# Patient Record
Sex: Female | Born: 1990 | Race: White | Hispanic: No | Marital: Single | Smoking: Never smoker
Health system: Southern US, Community
[De-identification: ages and names within clinical notes are randomized; demographics above are authoritative.]

## PROBLEM LIST (undated history)

## (undated) DIAGNOSIS — Z789 Other specified health status: Secondary | ICD-10-CM

## (undated) HISTORY — PX: NO PAST SURGERIES: SHX2092

---

## 2016-03-13 NOTE — L&D Delivery Note (Signed)
Delivery Note After 4 hours of pushing, the FHR began to have prolonged decel in the 80's.  Verbal consent: obtained from patient. Risks and benefits discussed in detail. Risks include, but are not limited to bleeding, infection, damage to maternal tissues, fetal cephalhematoma. There is also the risk of inability to effect vaginal delivery of the head, or shoulder dystocia that cannot be resolved by established maneuvers, leading to the need for emergency cesarean section  At 11:26 PM a viable female was delivered via Vaginal, Vacuum (Extractor) with 1 pop off (Presentation: ROA;  ).  APGAR: 5, 7; weight  Pending.  The shoulders were not forthcoming, so the posterior (right) axilla was grasped with my index finger, and the baby was rotated clockwise into the oblique diameter.  At this point, the (now) anterior shoulder was released, and the baby delivered.  At no time was any traction placed on the baby's head. .THe baby was floppy despite stimulation, so the cord was milked toward the baby, clamped and cut and the baby transferred to the awaiting NICU team.  Cord pH: pending  Anesthesia:  epidural Episiotomy: None Lacerations: 2nd degree;Vaginal Suture Repair: 3.0 vicryl Est. Blood Loss (mL): 394  Mom to postpartum.  Baby to Couplet care / Skin to Skin.  CRESENZO-DISHMAN,Jazlyn Tippens 02/09/2017, 12:04 AM  Please schedule this patient for PP visit in: 4 weeks Low risk pregnancy complicated by:  Delivery mode:  Vacuum Anticipated Birth Control:  Nexplanon PP Procedures needed:   Schedule Integrated BH visit: no Provider: Any provider

## 2016-07-03 ENCOUNTER — Encounter: Payer: Self-pay | Admitting: General Practice

## 2016-07-03 ENCOUNTER — Ambulatory Visit (INDEPENDENT_AMBULATORY_CARE_PROVIDER_SITE_OTHER): Payer: Self-pay | Admitting: *Deleted

## 2016-07-03 ENCOUNTER — Encounter: Payer: Self-pay | Admitting: *Deleted

## 2016-07-03 DIAGNOSIS — Z3201 Encounter for pregnancy test, result positive: Secondary | ICD-10-CM

## 2016-07-03 LAB — POCT PREGNANCY, URINE: PREG TEST UR: POSITIVE — AB

## 2016-07-03 NOTE — Progress Notes (Signed)
Patient presents to clinic for pregnancy test which is positive. EDD 02/04/17. Reviewed allergies and meds. Discussed starting prenatal vitamins, healthy lifestyle. Patient to front desk to schedule new ob visit and get pregnancy verification letter.

## 2016-08-01 ENCOUNTER — Ambulatory Visit (INDEPENDENT_AMBULATORY_CARE_PROVIDER_SITE_OTHER): Payer: BLUE CROSS/BLUE SHIELD | Admitting: Medical

## 2016-08-01 ENCOUNTER — Encounter: Payer: Self-pay | Admitting: Medical

## 2016-08-01 DIAGNOSIS — Z3400 Encounter for supervision of normal first pregnancy, unspecified trimester: Secondary | ICD-10-CM | POA: Insufficient documentation

## 2016-08-01 DIAGNOSIS — Z113 Encounter for screening for infections with a predominantly sexual mode of transmission: Secondary | ICD-10-CM

## 2016-08-01 DIAGNOSIS — Z124 Encounter for screening for malignant neoplasm of cervix: Secondary | ICD-10-CM

## 2016-08-01 DIAGNOSIS — Z3401 Encounter for supervision of normal first pregnancy, first trimester: Secondary | ICD-10-CM

## 2016-08-01 LAB — POCT URINALYSIS DIP (DEVICE)
BILIRUBIN URINE: NEGATIVE
GLUCOSE, UA: NEGATIVE mg/dL
HGB URINE DIPSTICK: NEGATIVE
Ketones, ur: NEGATIVE mg/dL
LEUKOCYTES UA: NEGATIVE
NITRITE: NEGATIVE
Protein, ur: NEGATIVE mg/dL
Specific Gravity, Urine: 1.02 (ref 1.005–1.030)
UROBILINOGEN UA: 0.2 mg/dL (ref 0.0–1.0)
pH: 7 (ref 5.0–8.0)

## 2016-08-01 LAB — OB RESULTS CONSOLE RUBELLA ANTIBODY, IGM: RUBELLA: IMMUNE

## 2016-08-01 NOTE — Progress Notes (Signed)
   PRENATAL VISIT NOTE  Subjective:  Felicia Lang is a 26 y.o. G1P0 at 9246w2d being seen today for ongoing prenatal care.  She is currently monitored for the following issues for this low-risk pregnancy and has Supervision of low-risk first pregnancy on her problem list.  Patient reports no complaints.  Contractions: Not present. Vag. Bleeding: None.  Movement: Absent. Denies leaking of fluid.   The following portions of the patient's history were reviewed and updated as appropriate: allergies, current medications, past family history, past medical history, past social history, past surgical history and problem list. Problem list updated.  Objective:   Vitals:   08/01/16 0815 08/01/16 0824  BP: 132/81   Pulse: 97   Weight: 145 lb (65.8 kg)   Height:  5\' 3"  (1.6 m)    Fetal Status: Fetal Heart Rate (bpm): 169   Movement: Absent     General:  Alert, oriented and cooperative. Patient is in no acute distress.  Skin: Skin is warm and dry. No rash noted.   Cardiovascular: Normal heart rate noted  Respiratory: Normal respiratory effort, no problems with respiration noted  Abdomen: Soft, non-tender, gravid, appropriate for gestational age. Pain/Pressure: Present     Pelvic:  Cervical exam performed Dilation: Closed Effacement (%): Thick   Normal pink vaginal tissue. Normal cervical contour without lesions. Small amount of thick, white discharge noted. Scant bleeding after pap. No CMT.  Extremities: Normal range of motion.  Edema: None  Breast Symmetric. No lumps. No nipple discharge. Mild diffuse tenderness.   Mental Status: Normal mood and affect. Normal behavior. Normal judgment and thought content.   Assessment and Plan:  Pregnancy: G1P0 at 2046w2d  1. Encounter for supervision of low-risk first pregnancy in first trimester - Culture, OB Urine - Prenatal Profile I - HIV antibody - US MFM OB COMP + 14 WK; scheduled - Cytology - PAP - US Fetal Nuchal Translucency Measurement; -  unable to schedule prior to [redacted] weeks GA, patient advised to call to see if there is a cancellation this week - Discussed prenatal classes, list of pediatricians given  - No risk factors for early GTT - Discussed routine prenatal care expectations  First trimester warning symptoms and general obstetric precautions including but not limited to vaginal bleeding, contractions, leaking of fluid and fetal movement were reviewed in detail with the patient. Please refer to After Visit Summary for other counseling recommendations.  Return in about 4 weeks (around 08/29/2016) for LOB.   Vonzella NippleJulie Mynor Witkop, PA-C

## 2016-08-01 NOTE — Patient Instructions (Addendum)
Second Trimester of Pregnancy The second trimester is from week 13 through week 28, month 4 through 6. This is often the time in pregnancy that you feel your best. Often times, morning sickness has lessened or quit. You may have more energy, and you may get hungry more often. Your unborn baby (fetus) is growing rapidly. At the end of the sixth month, he or she is about 9 inches long and weighs about 1 pounds. You will likely feel the baby move (quickening) between 18 and 20 weeks of pregnancy. Follow these instructions at home:  Avoid all smoking, herbs, and alcohol. Avoid drugs not approved by your doctor.  Do not use any tobacco products, including cigarettes, chewing tobacco, and electronic cigarettes. If you need help quitting, ask your doctor. You may get counseling or other support to help you quit.  Only take medicine as told by your doctor. Some medicines are safe and some are not during pregnancy.  Exercise only as told by your doctor. Stop exercising if you start having cramps.  Eat regular, healthy meals.  Wear a good support bra if your breasts are tender.  Do not use hot tubs, steam rooms, or saunas.  Wear your seat belt when driving.  Avoid raw meat, uncooked cheese, and liter boxes and soil used by cats.  Take your prenatal vitamins.  Take 1500-2000 milligrams of calcium daily starting at the 20th week of pregnancy until you deliver your baby.  Try taking medicine that helps you poop (stool softener) as needed, and if your doctor approves. Eat more fiber by eating fresh fruit, vegetables, and whole grains. Drink enough fluids to keep your pee (urine) clear or pale yellow.  Take warm water baths (sitz baths) to soothe pain or discomfort caused by hemorrhoids. Use hemorrhoid cream if your doctor approves.  If you have puffy, bulging veins (varicose veins), wear support hose. Raise (elevate) your feet for 15 minutes, 3-4 times a day. Limit salt in your diet.  Avoid heavy  lifting, wear low heals, and sit up straight.  Rest with your legs raised if you have leg cramps or low back pain.  Visit your dentist if you have not gone during your pregnancy. Use a soft toothbrush to brush your teeth. Be gentle when you floss.  You can have sex (intercourse) unless your doctor tells you not to.  Go to your doctor visits. Get help if:  You feel dizzy.  You have mild cramps or pressure in your lower belly (abdomen).  You have a nagging pain in your belly area.  You continue to feel sick to your stomach (nauseous), throw up (vomit), or have watery poop (diarrhea).  You have bad smelling fluid coming from your vagina.  You have pain with peeing (urination). Get help right away if:  You have a fever.  You are leaking fluid from your vagina.  You have spotting or bleeding from your vagina.  You have severe belly cramping or pain.  You lose or gain weight rapidly.  You have trouble catching your breath and have chest pain.  You notice sudden or extreme puffiness (swelling) of your face, hands, ankles, feet, or legs.  You have not felt the baby move in over an hour.  You have severe headaches that do not go away with medicine.  You have vision changes. This information is not intended to replace advice given to you by your health care provider. Make sure you discuss any questions you have with your health care   provider. Document Released: 05/24/2009 Document Revised: 08/05/2015 Document Reviewed: 04/30/2012 Elsevier Interactive Patient Education  2017 [place] [address] [place], [place]  [address] Phone - [phone]   Fax - [phone]  [facility] [address] [place], [place] [address] Phone - [phone]   Fax - [place] [address] [place], [place]  [address] Phone - [phone]   Fax -  [phone]  [place] [address] [place] [address] [place], [place]  [address] Phone - [phone]   Fax - [phone]  [place] [address] [place], [place]  [address] Phone - [phone]   Fax - [phone]  [facility] [address] [place], [place]  [address] Phone - [phone]   Fax - [place] [address], [place], [place]  [address] Phone - [phone]   Fax - [phone]  [place] [address] [place], [place] [address] [place], [place]  [address] Phone - [phone]   Fax - [place] [address] [place], [place]  [address] Phone - [phone]   Fax - [phone] [facility] [place] [address] [place], [place]  [address] Phone - [phone]   Fax - [phone]  [place] [address] [place], [place]  [address] Phone - [phone]   Fax - [phone]  [facility] [place], [place], [place]  [address] Phone - [phone]   Fax - [place] [address] [place], [place]  [address] Phone - [phone]   Fax - [phone]  [facility] [address], [place], [place]  [address] Phone - [place], [place]  [address] Phone - [phone]   Fax - [place] [address] [place], [place]  [address] Phone - [phone]   Fax - [phone]  [place] [address] [place], [place]  [address] Phone - [phone]   Fax - [place]. [place], [place]  [address] Phone - [phone]   Fax - [place], [place], [place]   [address] Phone - [phone]   Fax - [place] [address] [place], [place]  [address] Phone - [phone]   Fax - [phone]  [facility] [address] [place], [place]  [address] Phone - [phone]   Fax - [place] [address], [place], [place]  [address] Phone - [phone]   Fax - [phone]  [place] [address] [place], [place]  [address] Phone - [phone]   Fax - [phone] [place] [address] [place], [place]  [address] Phone - [phone]   Fax - [place] [address] [place],   [address] Phone - [phone]   Fax - [place] [address], [place],   [address] Phone - [phone]   Fax - [phone]  [place] [address] [place] [address] Phone  [phone]  Fax [phone]

## 2016-08-01 NOTE — Progress Notes (Signed)
DECLINED FLU INJECTION

## 2016-08-02 LAB — PRENATAL PROFILE I(LABCORP)
Antibody Screen: NEGATIVE
BASOS ABS: 0 10*3/uL (ref 0.0–0.2)
Basos: 0 %
EOS (ABSOLUTE): 0.1 10*3/uL (ref 0.0–0.4)
EOS: 1 %
HEP B S AG: NEGATIVE
Hematocrit: 39.1 % (ref 34.0–46.6)
Hemoglobin: 12.9 g/dL (ref 11.1–15.9)
IMMATURE GRANULOCYTES: 0 %
Immature Grans (Abs): 0 10*3/uL (ref 0.0–0.1)
Lymphocytes Absolute: 2.1 10*3/uL (ref 0.7–3.1)
Lymphs: 25 %
MCH: 29.8 pg (ref 26.6–33.0)
MCHC: 33 g/dL (ref 31.5–35.7)
MCV: 90 fL (ref 79–97)
MONOCYTES: 5 %
Monocytes Absolute: 0.4 10*3/uL (ref 0.1–0.9)
NEUTROS PCT: 69 %
Neutrophils Absolute: 5.7 10*3/uL (ref 1.4–7.0)
PLATELETS: 260 10*3/uL (ref 150–379)
RBC: 4.33 x10E6/uL (ref 3.77–5.28)
RDW: 14.2 % (ref 12.3–15.4)
RH TYPE: POSITIVE
RPR: NONREACTIVE
RUBELLA: 1.3 {index} (ref >0.99)
WBC: 8.4 10*3/uL (ref 3.4–10.8)

## 2016-08-02 LAB — HIV ANTIBODY (ROUTINE TESTING W REFLEX): HIV Screen 4th Generation wRfx: NONREACTIVE

## 2016-08-03 LAB — CYTOLOGY - PAP
CHLAMYDIA, DNA PROBE: NEGATIVE
DIAGNOSIS: NEGATIVE
Neisseria Gonorrhea: NEGATIVE
Trichomonas: NEGATIVE

## 2016-08-07 LAB — CULTURE, OB URINE

## 2016-08-07 LAB — URINE CULTURE, OB REFLEX

## 2016-08-08 ENCOUNTER — Other Ambulatory Visit: Payer: Self-pay | Admitting: Medical

## 2016-08-08 DIAGNOSIS — O2342 Unspecified infection of urinary tract in pregnancy, second trimester: Secondary | ICD-10-CM

## 2016-08-08 MED ORDER — CEPHALEXIN 500 MG PO CAPS: 500.0000 mg | ORAL_CAPSULE | Freq: Four times a day (QID) | ORAL | 0 refills | Status: DC

## 2016-08-09 ENCOUNTER — Telehealth: Payer: Self-pay | Admitting: *Deleted

## 2016-08-09 NOTE — Telephone Encounter (Addendum)
-----   Message from Marny LowensteinJulie N Wenzel, PA-C sent at 08/08/2016 12:48 PM EDT ----- Has UTI. Rx for Keflex sent to pharmacy. Please inform patient.   Thanks!   Raynelle FanningJulie  5/30  1130  Called pt and left message stating that I am calling with test result information.  Please call back and state whether a detailed message can be left on her voice mail.

## 2016-08-14 NOTE — Telephone Encounter (Signed)
Felicia Lang called back 08/11/16 pm and left a message she is returning our call for results and is ok to leave detailed information on her voicemail. I called and left amessage I am returing the call and that she has a uti and medication-keflex- has been sent to her pharmacy and she should take it four times a day x 7 days until all doses are gone and we may recheck her urine when she comes back in.

## 2016-08-29 ENCOUNTER — Encounter: Payer: BLUE CROSS/BLUE SHIELD | Admitting: Obstetrics and Gynecology

## 2016-08-30 ENCOUNTER — Encounter: Payer: BLUE CROSS/BLUE SHIELD | Admitting: Advanced Practice Midwife

## 2016-09-06 ENCOUNTER — Ambulatory Visit (INDEPENDENT_AMBULATORY_CARE_PROVIDER_SITE_OTHER): Payer: BLUE CROSS/BLUE SHIELD | Admitting: Student

## 2016-09-06 VITALS — BP 114/69 | HR 78 | Wt 148.5 lb

## 2016-09-06 DIAGNOSIS — Z3402 Encounter for supervision of normal first pregnancy, second trimester: Secondary | ICD-10-CM

## 2016-09-06 DIAGNOSIS — N3000 Acute cystitis without hematuria: Secondary | ICD-10-CM

## 2016-09-06 DIAGNOSIS — Z3400 Encounter for supervision of normal first pregnancy, unspecified trimester: Secondary | ICD-10-CM

## 2016-09-06 LAB — POCT URINALYSIS DIP (DEVICE)
Bilirubin Urine: NEGATIVE
GLUCOSE, UA: NEGATIVE mg/dL
Hgb urine dipstick: NEGATIVE
Ketones, ur: NEGATIVE mg/dL
LEUKOCYTES UA: NEGATIVE
NITRITE: NEGATIVE
PROTEIN: NEGATIVE mg/dL
Specific Gravity, Urine: 1.02 (ref 1.005–1.030)
UROBILINOGEN UA: 0.2 mg/dL (ref 0.0–1.0)
pH: 5.5 (ref 5.0–8.0)

## 2016-09-06 NOTE — Progress Notes (Signed)
   PRENATAL VISIT NOTE  Subjective:  Felicia Lang is a 26 y.o. G1P0 at 5465w3d being seen today for ongoing prenatal care.  She is currently monitored for the following issues for this low-risk pregnancy and has Supervision of low-risk first pregnancy on her problem list.  Patient reports no complaints.   .  .  Movement: Absent. Denies leaking of fluid.   The following portions of the patient's history were reviewed and updated as appropriate: allergies, current medications, past family history, past medical history, past social history, past surgical history and problem list. Problem list updated.  Objective:   Vitals:   09/06/16 1247  BP: 114/69  Pulse: 78  Weight: 148 lb 8 oz (67.4 kg)    Fetal Status: Fetal Heart Rate (bpm): 169   Movement: Absent     General:  Alert, oriented and cooperative. Patient is in no acute distress.  Skin: Skin is warm and dry. No rash noted.   Cardiovascular: Normal heart rate noted  Respiratory: Normal respiratory effort, no problems with respiration noted  Abdomen: Soft, gravid, appropriate for gestational age. Pain/Pressure: Present     Pelvic:  Cervical exam deferred        Extremities: Normal range of motion.  Edema: None  Mental Status: Normal mood and affect. Normal behavior. Normal judgment and thought content.   Assessment and Plan:  Pregnancy: G1P0 at 5365w3d  1. Acute cystitis without hematuria Patient states she completed her Keflex; denies any S/S at this time.  - Urinalysis  2. Supervision of normal first pregnancy, antepartum -MFM scheduled for 09-26-2016 - AFP, Serum, Open Spina Bifida  Preterm labor symptoms and general obstetric precautions including but not limited to vaginal bleeding, contractions, leaking of fluid and fetal movement were reviewed in detail with the patient. Please refer to After Visit Summary for other counseling recommendations.  Return in about 4 weeks (around 10/04/2016).   Marylene LandKathryn Lorraine Kooistra,  CNM

## 2016-09-07 LAB — URINALYSIS
BILIRUBIN UA: NEGATIVE
GLUCOSE, UA: NEGATIVE
KETONES UA: NEGATIVE
Leukocytes, UA: NEGATIVE
NITRITE UA: NEGATIVE
Protein, UA: NEGATIVE
RBC UA: NEGATIVE
Specific Gravity, UA: 1.014 (ref 1.005–1.030)
UUROB: 0.2 mg/dL (ref 0.2–1.0)
pH, UA: 6.5 (ref 5.0–7.5)

## 2016-09-10 LAB — AFP, SERUM, OPEN SPINA BIFIDA
AFP MoM: 0.66
AFP VALUE AFPOSL: 29.8 ng/mL
GEST. AGE ON COLLECTION DATE: 18.3 wk
Maternal Age At EDD: 26.9 yr
OSBR Risk 1 IN: 10000
Test Results:: NEGATIVE
Weight: 148 [lb_av]

## 2016-09-26 ENCOUNTER — Other Ambulatory Visit: Payer: Self-pay | Admitting: Medical

## 2016-09-26 ENCOUNTER — Ambulatory Visit (HOSPITAL_COMMUNITY)
Admission: RE | Admit: 2016-09-26 | Discharge: 2016-09-26 | Disposition: A | Discharge status: Home / Self Care | Payer: BLUE CROSS/BLUE SHIELD | Source: Ambulatory Visit | Attending: Medical | Admitting: Medical

## 2016-09-26 DIAGNOSIS — Z363 Encounter for antenatal screening for malformations: Secondary | ICD-10-CM | POA: Insufficient documentation

## 2016-09-26 DIAGNOSIS — Z3A21 21 weeks gestation of pregnancy: Secondary | ICD-10-CM | POA: Insufficient documentation

## 2016-09-26 DIAGNOSIS — Z3401 Encounter for supervision of normal first pregnancy, first trimester: Secondary | ICD-10-CM

## 2016-10-04 ENCOUNTER — Encounter: Payer: Self-pay | Admitting: Advanced Practice Midwife

## 2016-10-04 ENCOUNTER — Ambulatory Visit (INDEPENDENT_AMBULATORY_CARE_PROVIDER_SITE_OTHER): Payer: BLUE CROSS/BLUE SHIELD | Admitting: Advanced Practice Midwife

## 2016-10-04 VITALS — BP 128/75 | HR 105 | Wt 153.5 lb

## 2016-10-04 DIAGNOSIS — Z3402 Encounter for supervision of normal first pregnancy, second trimester: Secondary | ICD-10-CM

## 2016-10-04 NOTE — Progress Notes (Signed)
   PRENATAL VISIT NOTE  Subjective:  Felicia Lang is a 26 y.o. G1P0 at 4925w3d being seen today for ongoing prenatal care.  She is currently monitored for the following issues for this low-risk pregnancy and has Supervision of low-risk first pregnancy on her problem list.  Patient reports no complaints.  Contractions: Not present. Vag. Bleeding: None.  Movement: Present. Denies leaking of fluid.   The following portions of the patient's history were reviewed and updated as appropriate: allergies, current medications, past family history, past medical history, past social history, past surgical history and problem list. Problem list updated.  Incomplete anatomy.   Objective:   Vitals:   10/04/16 1059  BP: 128/75  Pulse: (!) 105  Weight: 153 lb 8 oz (69.6 kg)    Fetal Status: Fetal Heart Rate (bpm): 169 Fundal Height: 22 cm Movement: Present     General:  Alert, oriented and cooperative. Patient is in no acute distress.  Skin: Skin is warm and dry. No rash noted.   Cardiovascular: Normal heart rate noted  Respiratory: Normal respiratory effort, no problems with respiration noted  Abdomen: Soft, gravid, appropriate for gestational age.  Pain/Pressure: Present     Pelvic: Cervical exam deferred        Extremities: Normal range of motion.  Edema: None  Mental Status:  Normal mood and affect. Normal behavior. Normal judgment and thought content.   Assessment and Plan:  Pregnancy: G1P0 at 3625w3d  1. Encounter for supervision of low-risk first pregnancy in second trimester  - US MFM OB FOLLOW UP; Future  Preterm labor symptoms and general obstetric precautions including but not limited to vaginal bleeding, contractions, leaking of fluid and fetal movement were reviewed in detail with the patient. Please refer to After Visit Summary for other counseling recommendations.  Return in about 4 weeks (around 11/01/2016) for ROB/GTT.  Peds list Enc CBE.   Dorathy KinsmanVirginia Anaih Brander, CNM

## 2016-10-04 NOTE — Patient Instructions (Addendum)
AREA PEDIATRIC/FAMILY PRACTICE PHYSICIANS  [facility] [address], [address] [place], [place]  [address] Phone - [phone]   Fax - [phone]  [facility] [address] [place], [place] [address] Phone - [phone]   Fax - [phone]  JACK AMOS [address] [place], [place]  [address] Phone - [phone]   Fax - [phone]  [facility] [address] [place], [place]  [address] Phone - [phone]   Fax - [phone]  [facility] [address] [place], [place]  [address] Phone - [phone]   Fax - [phone]  [facility] [address], [address] [place], [place]  [address] Phone - [phone]   Fax - [phone]  [facility] [address], [address] [place], [place]  [address] Phone - [phone]   Fax - [phone]  [facility] [address] [place], [place]  [address] Phone - [phone]   Fax - [phone]  [facility] [address] [place], [place]  [address] Phone - [phone]   Fax - [phone] [facility] [address] [place], [place]  [address] Phone - [phone]   Fax - [phone]  [facility] [address] [place], [place]  [address] Phone - [phone]   Fax - [phone]  [facility] [address], [address] [place], [place]  [address] Phone - [phone]   Fax - [phone]  [facility] [address] [place], [place]  [address] Phone - [phone]   Fax - [phone]  [facility] [address], [address] [place], [place]  [address] Phone - [phone]  [facility] [address] [place], [place]  [address] Phone - [phone]   Fax - [phone]  [facility] [address], [address] [place], [place]  [address] Phone - [phone]   Fax - [phone]  [facility] [address] [place], [place]  [address] Phone - [phone]   Fax - [phone]  [facility] [address] [place], [place]  [address] Phone - [phone]   Fax - [phone]   [facility] [address] [place], [place]  [address] Phone - [phone]   Fax - [phone]  [facility] [address], [address] [place], [place]  [address] Phone - [phone]   Fax - [phone]  DAVID RUBIN [address] [place], [place]  [address] Phone - [phone]   Fax - [phone]  [facility] [address], [address] [place], [place]  [address] Phone - [phone]   Fax - [phone]  [facility] [address] [place], [place]  [address] Phone - [phone]   Fax - [phone] [facility] [address] [place], [place]  [address] Phone - [phone]   Fax - [phone]  [facility] [address] [place], [place]  [address] Phone - [phone]   Fax - [phone]  [facility]  [address] [place], [place]  [address] Phone - [phone]   Fax - [phone]  [facility] Wyvonne Lenzharlene Flemming MD [address] [place] [address] Phone [phone]  Fax [phone]   Preterm Labor and Birth Information The normal length of a pregnancy is 39-41 weeks.  Preterm labor is when labor starts before 37 completed weeks of pregnancy. What are the risk factors for preterm labor? Preterm labor is more likely to occur in women who:  Have certain infections during pregnancy such as a bladder infection, sexually transmitted infection, or infection inside the uterus (chorioamnionitis).  Have a shorter-than-normal cervix.  Have gone into preterm labor  before.  Have had surgery on their cervix.  Are younger than age 17 or older than age 35.  Are African American.  Are pregnant with twins or multiple babies (multiple gestation).  Take street drugs or smoke while pregnant.  Do not gain enough weight while pregnant.  Became pregnant shortly after having been pregnant.  What are the symptoms of preterm labor? Symptoms of preterm labor include:  Cramps similar to those that can happen during a menstrual period. The cramps may happen with diarrhea.  Pain in the abdomen or lower back.  Regular uterine contractions that may feel like tightening of the abdomen.  A feeling of increased pressure in the pelvis.  Increased watery or bloody mucus discharge from the vagina.  Water breaking (ruptured amniotic sac).  Why is it important to recognize signs of preterm labor? It is important to recognize signs of preterm labor because babies who are born prematurely may not be fully developed. This can put them at an increased risk for:  Long-term (chronic) heart and lung problems.  Difficulty immediately after birth with regulating body systems, including blood sugar, body temperature, heart rate, and breathing rate.  Bleeding in the brain.  Cerebral palsy.  Learning difficulties.  Death.  These risks are highest for babies who are born before 34 weeks of pregnancy. How is preterm labor treated? Treatment depends on the length of your pregnancy, your condition, and the health of your baby. It may involve:  Having a stitch (suture) placed in your cervix to prevent your cervix from opening too early (cerclage).  Taking or being given medicines, such as: ? Hormone medicines. These may be given early in pregnancy to help support the pregnancy. ? Medicine to stop contractions. ? Medicines to help mature the baby's lungs. These may be prescribed if the risk of delivery is high. ? Medicines to prevent your baby from developing  cerebral palsy.  If the labor happens before 34 weeks of pregnancy, you may need to stay in the hospital. What should I do if I think I am in preterm labor? If you think that you are going into preterm labor, call your health care provider right away. How can I prevent preterm labor in future pregnancies? To increase your chance of having a full-term pregnancy:  Do not use any tobacco products, such as cigarettes, chewing tobacco, and e-cigarettes. If you need help quitting, ask your health care provider.  Do not use street drugs or medicines that have not been prescribed to you during your pregnancy.  Talk with your health care provider before taking any herbal supplements, even if you have been taking them regularly.  Make sure you gain a healthy amount of weight during your pregnancy.  Watch for infection. If you think that you might have an infection, get it checked right away.  Make sure to tell your health care provider if you have gone into preterm labor before.  This information is not intended to replace advice given to you by your health care provider. Make sure you discuss any questions you have with your health care provider. Document Released: 05/20/2003 Document Revised: 08/10/2015   Document Reviewed: 07/21/2015 Elsevier Interactive Patient Education  [employer]2018 Elsevier Inc.

## 2016-10-18 ENCOUNTER — Other Ambulatory Visit: Payer: Self-pay | Admitting: Advanced Practice Midwife

## 2016-10-18 ENCOUNTER — Ambulatory Visit (HOSPITAL_COMMUNITY)
Admission: RE | Admit: 2016-10-18 | Discharge: 2016-10-18 | Disposition: A | Discharge status: Home / Self Care | Payer: BLUE CROSS/BLUE SHIELD | Source: Ambulatory Visit | Attending: Advanced Practice Midwife | Admitting: Advanced Practice Midwife

## 2016-10-18 DIAGNOSIS — IMO0002 Reserved for concepts with insufficient information to code with codable children: Secondary | ICD-10-CM

## 2016-10-18 DIAGNOSIS — Z3402 Encounter for supervision of normal first pregnancy, second trimester: Secondary | ICD-10-CM

## 2016-10-18 DIAGNOSIS — Z0489 Encounter for examination and observation for other specified reasons: Secondary | ICD-10-CM

## 2016-10-18 DIAGNOSIS — Z362 Encounter for other antenatal screening follow-up: Secondary | ICD-10-CM | POA: Insufficient documentation

## 2016-10-18 DIAGNOSIS — Z3A24 24 weeks gestation of pregnancy: Secondary | ICD-10-CM | POA: Diagnosis not present

## 2016-10-18 DIAGNOSIS — Z048 Encounter for examination and observation for other specified reasons: Secondary | ICD-10-CM | POA: Insufficient documentation

## 2016-11-02 ENCOUNTER — Ambulatory Visit (INDEPENDENT_AMBULATORY_CARE_PROVIDER_SITE_OTHER): Payer: BLUE CROSS/BLUE SHIELD | Admitting: Medical

## 2016-11-02 VITALS — BP 114/74 | HR 92 | Wt 164.3 lb

## 2016-11-02 DIAGNOSIS — Z3493 Encounter for supervision of normal pregnancy, unspecified, third trimester: Secondary | ICD-10-CM

## 2016-11-02 DIAGNOSIS — Z3403 Encounter for supervision of normal first pregnancy, third trimester: Secondary | ICD-10-CM

## 2016-11-02 DIAGNOSIS — Z23 Encounter for immunization: Secondary | ICD-10-CM

## 2016-11-02 NOTE — Progress Notes (Signed)
   PRENATAL VISIT NOTE  Subjective:  Felicia Lang is a 26 y.o. G1P0 at [redacted]w[redacted]d being seen today for ongoing prenatal care.  She is currently monitored for the following issues for this low-risk pregnancy and has Supervision of low-risk first pregnancy on her problem list.  Patient reports no complaints.  Contractions: Not present. Vag. Bleeding: None.  Movement: Present. Denies leaking of fluid.   The following portions of the patient's history were reviewed and updated as appropriate: allergies, current medications, past family history, past medical history, past social history, past surgical history and problem list. Problem list updated.  Objective:   Vitals:   11/02/16 0748  BP: 114/74  Pulse: 92  Weight: 164 lb 4.8 oz (74.5 kg)    Fetal Status: Fetal Heart Rate (bpm): 152 Fundal Height: 25 cm Movement: Present     General:  Alert, oriented and cooperative. Patient is in no acute distress.  Skin: Skin is warm and dry. No rash noted.   Cardiovascular: Normal heart rate noted  Respiratory: Normal respiratory effort, no problems with respiration noted  Abdomen: Soft, gravid, appropriate for gestational age.  Pain/Pressure: Absent     Pelvic: Cervical exam deferred        Extremities: Normal range of motion.  Edema: Trace  Mental Status:  Normal mood and affect. Normal behavior. Normal judgment and thought content.   Assessment and Plan:  Pregnancy: G1P0 at [redacted]w[redacted]d  1. Supervision of low-risk pregnancy, third trimester - Glucose Tolerance, 2 Hours w/1 Hour - CBC - RPR - HIV antibody - Tdap vaccine greater than or equal to 7yo IM  Preterm labor symptoms and general obstetric precautions including but not limited to vaginal bleeding, contractions, leaking of fluid and fetal movement were reviewed in detail with the patient. Please refer to After Visit Summary for other counseling recommendations.  Return in about 2 weeks (around 11/16/2016) for LOB.   Vonzella Nipple, PA-C

## 2016-11-02 NOTE — Patient Instructions (Signed)

## 2016-11-03 LAB — CBC
HEMATOCRIT: 33.6 % — AB (ref 34.0–46.6)
HEMOGLOBIN: 11.3 g/dL (ref 11.1–15.9)
MCH: 30.6 pg (ref 26.6–33.0)
MCHC: 33.6 g/dL (ref 31.5–35.7)
MCV: 91 fL (ref 79–97)
PLATELETS: 224 10*3/uL (ref 150–379)
RBC: 3.69 x10E6/uL — AB (ref 3.77–5.28)
RDW: 13.7 % (ref 12.3–15.4)
WBC: 9.6 10*3/uL (ref 3.4–10.8)

## 2016-11-03 LAB — RPR: RPR Ser Ql: NONREACTIVE

## 2016-11-03 LAB — GLUCOSE TOLERANCE, 2 HOURS W/ 1HR
GLUCOSE, FASTING: 73 mg/dL (ref 65–91)
Glucose, 1 hour: 116 mg/dL (ref 65–179)
Glucose, 2 hour: 61 mg/dL — ABNORMAL LOW (ref 65–152)

## 2016-11-03 LAB — HIV ANTIBODY (ROUTINE TESTING W REFLEX): HIV Screen 4th Generation wRfx: NONREACTIVE

## 2016-11-16 ENCOUNTER — Ambulatory Visit (INDEPENDENT_AMBULATORY_CARE_PROVIDER_SITE_OTHER): Payer: BLUE CROSS/BLUE SHIELD | Admitting: Obstetrics and Gynecology

## 2016-11-16 VITALS — BP 127/74 | HR 74 | Wt 164.7 lb

## 2016-11-16 DIAGNOSIS — Z3403 Encounter for supervision of normal first pregnancy, third trimester: Secondary | ICD-10-CM

## 2016-11-16 NOTE — Progress Notes (Signed)
Subjective:  Felicia Lang is a 26 y.o. G1P0 at 12107w4d being seen today for ongoing prenatal care.  She is currently monitored for the following issues for this low-risk pregnancy and has Supervision of low-risk first pregnancy on her problem list.  Patient reports no complaints.  Contractions: Not present. Vag. Bleeding: None.  Movement: Present. Denies leaking of fluid.   The following portions of the patient's history were reviewed and updated as appropriate: allergies, current medications, past family history, past medical history, past social history, past surgical history and problem list. Problem list updated.  Objective:   Vitals:   11/16/16 1047  BP: 127/74  Pulse: 74  Weight: 164 lb 11.2 oz (74.7 kg)    Fetal Status: Fetal Heart Rate (bpm): 155 Fundal Height: 30 cm Movement: Present     General:  Alert, oriented and cooperative. Patient is in no acute distress.  Skin: Skin is warm and dry. No rash noted.   Cardiovascular: Normal heart rate noted  Respiratory: Normal respiratory effort, no problems with respiration noted  Abdomen: Soft, gravid, appropriate for gestational age. Pain/Pressure: Absent     Pelvic: Vag. Bleeding: None     Cervical exam deferred        Extremities: Normal range of motion.  Edema: Trace  Mental Status: Normal mood and affect. Normal behavior. Normal judgment and thought content.   Urinalysis:      Assessment and Plan:  Pregnancy: G1P0 at 601[address]w4d  1. Encounter for supervision of low-risk first pregnancy in third trimester Continue routine prenatal care.   Preterm labor symptoms and general obstetric precautions including but not limited to vaginal bleeding, contractions, leaking of fluid and fetal movement were reviewed in detail with the patient. Please refer to After Visit Summary for other counseling recommendations.  Return in about 2 weeks (around 11/30/2016) for ob visit.   Caryl AdaJazma Phelps, DO OB Fellow Faculty Practice, [facility]  [place] 11/16/2016, 11:05 AM

## 2016-11-16 NOTE — Patient Instructions (Signed)

## 2016-11-29 ENCOUNTER — Ambulatory Visit (INDEPENDENT_AMBULATORY_CARE_PROVIDER_SITE_OTHER): Payer: BLUE CROSS/BLUE SHIELD | Admitting: Obstetrics and Gynecology

## 2016-11-29 ENCOUNTER — Encounter: Payer: Self-pay | Admitting: Obstetrics and Gynecology

## 2016-11-29 VITALS — BP 135/78 | HR 109 | Wt 170.0 lb

## 2016-11-29 DIAGNOSIS — Z3403 Encounter for supervision of normal first pregnancy, third trimester: Secondary | ICD-10-CM

## 2016-11-29 NOTE — Patient Instructions (Signed)

## 2016-11-29 NOTE — Progress Notes (Signed)
Subjective:  Felicia Lang is a 26 y.o. G1P0 at [redacted]w[redacted]d being seen today for ongoing prenatal care.  She is currently monitored for the following issues for this low-risk pregnancy and has Supervision of low-risk first pregnancy on her problem list.  Patient reports swelling in bilateral lower extremities.  Contractions: Not present. Vag. Bleeding: None.  Movement: Present. Denies leaking of fluid.   The following portions of the patient's history were reviewed and updated as appropriate: allergies, current medications, past family history, past medical history, past social history, past surgical history and problem list. Problem list updated.  Objective:   Vitals:   11/29/16 1011  BP: 135/78  Pulse: (!) 109  Weight: 77.1 kg (170 lb)    Fetal Status: Fetal Heart Rate (bpm): 148 Fundal Height: 31 cm Movement: Present     General:  Alert, oriented and cooperative. Patient is in no acute distress.  Skin: Skin is warm and dry. No rash noted.   Cardiovascular: Normal heart rate noted  Respiratory: Normal respiratory effort, no problems with respiration noted  Abdomen: Soft, gravid, appropriate for gestational age. Pain/Pressure: Absent     Pelvic: Vag. Bleeding: None     Cervical exam deferred        Extremities: Normal range of motion.  Edema: Mild pitting, slight indentation  Mental Status: Normal mood and affect. Normal behavior. Normal judgment and thought content.   Urinalysis:      Assessment and Plan:  Pregnancy: G1P0 at [redacted]w[redacted]d  1. Encounter for supervision of low-risk first pregnancy in third trimester Continue routine postpartum care. 28wk labs reviewed and are normal. Patient encouraged to use compression stockings for leg swelling.   Preterm labor symptoms and general obstetric precautions including but not limited to vaginal bleeding, contractions, leaking of fluid and fetal movement were reviewed in detail with the patient. Please refer to After Visit Summary for other  counseling recommendations.  Return in about 2 weeks (around 12/13/2016) for ob visit.   Caryl Ada, DO OB Fellow 11/29/2016, 10:34 AM

## 2016-12-13 ENCOUNTER — Ambulatory Visit (INDEPENDENT_AMBULATORY_CARE_PROVIDER_SITE_OTHER): Payer: BLUE CROSS/BLUE SHIELD | Admitting: Advanced Practice Midwife

## 2016-12-13 VITALS — BP 126/80 | HR 116 | Wt 171.8 lb

## 2016-12-13 DIAGNOSIS — Z23 Encounter for immunization: Secondary | ICD-10-CM

## 2016-12-13 DIAGNOSIS — Z3403 Encounter for supervision of normal first pregnancy, third trimester: Secondary | ICD-10-CM

## 2016-12-13 NOTE — Progress Notes (Signed)
   PRENATAL VISIT NOTE  Subjective:  Felicia Lang is a 26 y.o. G1P0 at [redacted]w[redacted]d being seen today for ongoing prenatal care.  She is currently monitored for the following issues for this low-risk pregnancy and has Supervision of low-risk first pregnancy on her problem list.  Patient reports no complaints.  Contractions: Irritability. Vag. Bleeding: None.  Movement: Present. Denies leaking of fluid.   The following portions of the patient's history were reviewed and updated as appropriate: allergies, current medications, past family history, past medical history, past social history, past surgical history and problem list. Problem list updated.  Objective:   Vitals:   12/13/16 0849  BP: 126/80  Pulse: (!) 116  Weight: 171 lb 12.8 oz (77.9 kg)    Fetal Status: Fetal Heart Rate (bpm): 165 Fundal Height: 33 cm Movement: Present     General:  Alert, oriented and cooperative. Patient is in no acute distress.  Skin: Skin is warm and dry. No rash noted.   Cardiovascular: Normal heart rate noted  Respiratory: Normal respiratory effort, no problems with respiration noted  Abdomen: Soft, gravid, appropriate for gestational age.  Pain/Pressure: Absent     Pelvic: Cervical exam deferred        Extremities: Normal range of motion.  Edema: Trace  Mental Status:  Normal mood and affect. Normal behavior. Normal judgment and thought content.   Assessment and Plan:  Pregnancy: G1P0 at [redacted]w[redacted]d  1. Need for immunization against influenza  - Flu Vaccine QUAD 6+ mos IM (Fluarix)  Preterm labor symptoms and general obstetric precautions including but not limited to vaginal bleeding, contractions, leaking of fluid and fetal movement were reviewed in detail with the patient. Please refer to After Visit Summary for other counseling recommendations.  Return in about 2 weeks (around 12/27/2016) for ROB.   Dorathy Kinsman, CNM

## 2016-12-13 NOTE — Patient Instructions (Addendum)
Pregnancy and Influenza Influenza, also called the flu, is an infection of the respiratory tract. If you are pregnant, you are more likely to catch the flu. You are also more likely to have a more serious case of the flu. This is because pregnancy lowers your body's ability to fight off infections (it weakens your immune system). It also puts additional stress on your heart and lungs, which makes you more likely to have complications. Having a bad case of the flu, especially with a high fever, can be dangerous for your developing baby. It can cause you to go into early labor. How do people get the flu? The flu is caused by the influenza virus. This virus is common every year in the fall and winter. It spreads when virus particles get passed from person to person. You can get the virus if you are near a sick person who is coughing or sneezing. You can also get the virus if you touch something that has the virus on it and then touch your face. How can I protect myself against the flu?  Get a flu shot. The best way to prevent the flu is to get a flu shot before flu season starts. The flu shot is not dangerous for your developing baby. It may even help protect your baby from the flu for up to 6 months after birth. The flu shot is one type of flu vaccine. Another type is a nasal spray vaccine. Do not get the nasal spray vaccine. It is not approved for pregnancy.  Do not come in close contact with sick people.  Do not share food, drinks, or utensils with other people.  Wash your hands often. Use hand sanitizer when soap and water are not available. What should I do if I have flu symptoms? If you have any flu symptoms, call your health care provider right away. Flu symptoms include:  Fever or chills.  Muscle aches.  Headache.  Sore throat.  Nasal congestion.  Cough.  Feeling tired.  Loss of appetite.  Vomiting.  Diarrhea.  You may be able to take an antiviral medicine to keep the flu  from becoming severe and to shorten how long it lasts. What should I do at home if I am diagnosed with the flu?  Do not take any medicine, including cold or flu medicine, unless directed by your health care provider.  If you take antiviral medicine, make sure you finish it even if you start to feel better.  Drink enough fluid to keep your urine clear or pale yellow.  Get plenty of rest. When would I seek immediate medical care if I have the flu?  You have trouble breathing.  You have chest pain.  You begin to have labor pains.  You have a high fever that does not go down after you take medicine.  You do not feel your baby move.  You have diarrhea or vomiting that will not go away. This information is not intended to replace advice given to you by your health care provider. Make sure you discuss any questions you have with your health care provider. Document Released: 12/31/2007 Document Revised: 08/05/2015 Document Reviewed: 01/24/2013 Elsevier Interactive Patient Education  2017 Elsevier Inc.  

## 2016-12-28 ENCOUNTER — Ambulatory Visit (INDEPENDENT_AMBULATORY_CARE_PROVIDER_SITE_OTHER): Payer: BLUE CROSS/BLUE SHIELD | Admitting: Student

## 2016-12-28 VITALS — BP 136/87 | HR 118 | Wt 177.5 lb

## 2016-12-28 DIAGNOSIS — Z3403 Encounter for supervision of normal first pregnancy, third trimester: Secondary | ICD-10-CM

## 2016-12-28 NOTE — Progress Notes (Signed)
   PRENATAL VISIT NOTE  Subjective:  Felicia Lang is a 26 y.o. G1P0 at 6244w4d being seen today for ongoing prenatal care.  She is currently monitored for the following issues for this low-risk pregnancy and has Supervision of low-risk first pregnancy on her problem list.  Patient reports no complaints.  Contractions: Irregular. Vag. Bleeding: None.  Movement: Present. Denies leaking of fluid.   The following portions of the patient's history were reviewed and updated as appropriate: allergies, current medications, past family history, past medical history, past social history, past surgical history and problem list. Problem list updated.  Objective:   Vitals:   12/28/16 0918  BP: 136/87  Pulse: (!) 118  Weight: 177 lb 8 oz (80.5 kg)    Fetal Status: Fetal Heart Rate (bpm): 145 Fundal Height: 35 cm Movement: Present     General:  Alert, oriented and cooperative. Patient is in no acute distress.  Skin: Skin is warm and dry. No rash noted.   Cardiovascular: Normal heart rate noted  Respiratory: Normal respiratory effort, no problems with respiration noted  Abdomen: Soft, gravid, appropriate for gestational age.  Pain/Pressure: Absent     Pelvic: Cervical exam deferred        Extremities: Normal range of motion.  Edema: None  Mental Status:  Normal mood and affect. Normal behavior. Normal judgment and thought content.   Assessment and Plan:  Pregnancy: G1P0 at 444w4d  1. Encounter for supervision of low-risk first pregnancy in third trimester No complaints; a few Braxton Hicks.   Preterm labor symptoms and general obstetric precautions including but not limited to vaginal bleeding, contractions, leaking of fluid and fetal movement were reviewed in detail with the patient. Please refer to After Visit Summary for other counseling recommendations.  Return in about 2 weeks (around 01/11/2017), or ROB.   Marylene LandKathryn Lorraine Kooistra, CNM

## 2016-12-28 NOTE — Patient Instructions (Signed)

## 2017-01-04 ENCOUNTER — Ambulatory Visit (INDEPENDENT_AMBULATORY_CARE_PROVIDER_SITE_OTHER): Payer: BLUE CROSS/BLUE SHIELD | Admitting: Student

## 2017-01-04 VITALS — BP 123/81 | HR 127 | Wt 176.9 lb

## 2017-01-04 DIAGNOSIS — Z3403 Encounter for supervision of normal first pregnancy, third trimester: Secondary | ICD-10-CM

## 2017-01-04 NOTE — Patient Instructions (Signed)
Group B Streptococcus Infection During Pregnancy Group B Streptococcus (GBS) is a type of bacteria (Streptococcus agalactiae) that is often found in healthy people, commonly in the rectum, vagina, and intestines. In people who are healthy and not pregnant, the bacteria rarely cause serious illness or complications. However, women who test positive for GBS during pregnancy can pass the bacteria to their baby during childbirth, which can cause serious infection in the baby after birth. Women with GBS may also have infections during their pregnancy or immediately after childbirth, such as such as urinary tract infections (UTIs) or infections of the uterus (uterine infections). Having GBS also increases a woman's risk of complications during pregnancy, such as early (preterm) labor or delivery, miscarriage, or stillbirth. Routine testing (screening) for GBS is recommended for all pregnant women. What increases the risk? You may have a higher risk for GBS infection during pregnancy if you had one during a past pregnancy. What are the signs or symptoms? In most cases, GBS infection does not cause symptoms in pregnant women. Signs and symptoms of a possible GBS-related infection may include:  Labor starting before the 37th week of pregnancy.  A UTI or bladder infection, which may cause: ? Fever. ? Pain or burning during urination. ? Frequent urination.  Fever during labor, along with: ? Bad-smelling discharge. ? Uterine tenderness. ? Rapid heartbeat in the mother, baby, or both.  Rare but serious symptoms of a possible GBS-related infection in women include:  Blood infection (septicemia). This may cause fever, chills, or confusion.  Lung infection (pneumonia). This may cause fever, chills, cough, rapid breathing, difficulty breathing, or chest pain.  Bone, joint, skin, or soft tissue infection.  How is this diagnosed? You may be screened for GBS between week 35 and week 37 of your pregnancy. If  you have symptoms of preterm labor, you may be screened earlier. This condition is diagnosed based on lab test results from:  A swab of fluid from the vagina and rectum.  A urine sample.  How is this treated? This condition is treated with antibiotic medicine. When you go into labor, or as soon as your water breaks (your membranes rupture), you will be given antibiotics through an IV tube. Antibiotics will continue until after you give birth. If you are having a cesarean delivery, you do not need antibiotics unless your membranes have already ruptured. Follow these instructions at home:  Take over-the-counter and prescription medicines only as told by your health care provider.  Take your antibiotic medicine as told by your health care provider. Do not stop taking the antibiotic even if you start to feel better.  Keep all pre-birth (prenatal) visits and follow-up visits as told by your health care provider. This is important. Contact a health care provider if:  You have pain or burning when you urinate.  You have to urinate frequently.  You have a fever or chills.  You develop a bad-smelling vaginal discharge. Get help right away if:  Your membranes rupture.  You go into labor.  You have severe pain in your abdomen.  You have difficulty breathing.  You have chest pain. This information is not intended to replace advice given to you by your health care provider. Make sure you discuss any questions you have with your health care provider. Document Released: 06/06/2007 Document Revised: 09/24/2015 Document Reviewed: 09/23/2015 Elsevier Interactive Patient Education  2018 [employer] Inc.  

## 2017-01-04 NOTE — Progress Notes (Signed)
   PRENATAL VISIT NOTE  Subjective:  Felicia Lang is a 26 y.o. G1P0 at 3430w4d being seen today for ongoing prenatal care.  She is currently monitored for the following issues for this low-risk pregnancy and has Supervision of low-risk first pregnancy on her problem list.  Patient reports no complaints.  Contractions: Irregular. Vag. Bleeding: None.  Movement: Present. Denies leaking of fluid.   The following portions of the patient's history were reviewed and updated as appropriate: allergies, current medications, past family history, past medical history, past social history, past surgical history and problem list. Problem list updated.  Objective:   Vitals:   01/04/17 0745  BP: 123/81  Pulse: (!) 127  Weight: 176 lb 14.4 oz (80.2 kg)    Fetal Status: Fetal Heart Rate (bpm): 154 Fundal Height: 35 cm Movement: Present     General:  Alert, oriented and cooperative. Patient is in no acute distress.  Skin: Skin is warm and dry. No rash noted.   Cardiovascular: Normal heart rate noted  Respiratory: Normal respiratory effort, no problems with respiration noted  Abdomen: Soft, gravid, appropriate for gestational age.  Pain/Pressure: Absent     Pelvic: Cervical exam deferred        Extremities: Normal range of motion.  Edema: Trace  Mental Status:  Normal mood and affect. Normal behavior. Normal judgment and thought content.   Assessment and Plan:  Pregnancy: G1P0 at 2430w4d  1. Encounter for supervision of low-risk first pregnancy in third trimester -GBS and GC CT next week  Preterm labor symptoms and general obstetric precautions including but not limited to vaginal bleeding, contractions, leaking of fluid and fetal movement were reviewed in detail with the patient. Please refer to After Visit Summary for other counseling recommendations.  Return in about 1 week (around 01/11/2017), or ROB.   Marylene LandKathryn Lorraine Kooistra, CNM

## 2017-01-11 ENCOUNTER — Ambulatory Visit (INDEPENDENT_AMBULATORY_CARE_PROVIDER_SITE_OTHER): Payer: BLUE CROSS/BLUE SHIELD | Admitting: Student

## 2017-01-11 VITALS — BP 136/78 | HR 115 | Wt 176.8 lb

## 2017-01-11 DIAGNOSIS — Z029 Encounter for administrative examinations, unspecified: Secondary | ICD-10-CM

## 2017-01-11 DIAGNOSIS — Z113 Encounter for screening for infections with a predominantly sexual mode of transmission: Secondary | ICD-10-CM | POA: Diagnosis not present

## 2017-01-11 DIAGNOSIS — Z3403 Encounter for supervision of normal first pregnancy, third trimester: Secondary | ICD-10-CM

## 2017-01-11 LAB — OB RESULTS CONSOLE GC/CHLAMYDIA
GC PROBE AMP, GENITAL: NEGATIVE
GC PROBE AMP, GENITAL: NEGATIVE

## 2017-01-11 LAB — OB RESULTS CONSOLE GBS: STREP GROUP B AG: NEGATIVE

## 2017-01-11 NOTE — Patient Instructions (Signed)

## 2017-01-11 NOTE — Progress Notes (Signed)
   PRENATAL VISIT NOTE  Subjective:  Felicia Lang is a 26 y.o. G1P0 at 3546w4d being seen today for ongoing prenatal care.  She is currently monitored for the following issues for this low-risk pregnancy and has Supervision of low-risk first pregnancy on her problem list.  Patient reports pedal edema. Denies HA, blurry vision, epigastric pain. .  Contractions: Not present. Vag. Bleeding: None.  Movement: Present. Denies leaking of fluid. She was on her feet all day yesterday at work and last night her lower extremities were swollen.   The following portions of the patient's history were reviewed and updated as appropriate: allergies, current medications, past family history, past medical history, past social history, past surgical history and problem list. Problem list updated.  Objective:   Vitals:   01/11/17 0758  BP: 136/78  Pulse: (!) 115  Weight: 176 lb 12.8 oz (80.2 kg)    Fetal Status: Fetal Heart Rate (bpm): 158 Fundal Height: 36 cm Movement: Present     General:  Alert, oriented and cooperative. Patient is in no acute distress.  Skin: Skin is warm and dry. No rash noted.   Cardiovascular: Normal heart rate noted  Respiratory: Normal respiratory effort, no problems with respiration noted  Abdomen: Soft, gravid, appropriate for gestational age.  Pain/Pressure: Absent     Pelvic: Cervical exam performed        Extremities: Normal range of motion.  Edema: Moderate pitting, indentation subsides rapidly  Mental Status:  Normal mood and affect. Normal behavior. Normal judgment and thought content.   Assessment and Plan:  Pregnancy: G1P0 at 7346w4d  1. Supervision of low-risk first pregnancy, third trimester  - Strep Gp B NAA - Cervicovaginal ancillary only  2. Encounter for supervision of low-risk first pregnancy in third trimester Patient doing well; advised to rest and elevate her feet.   Term labor symptoms and general obstetric precautions including but not limited to  vaginal bleeding, contractions, leaking of fluid and fetal movement were reviewed in detail with the patient. Please refer to After Visit Summary for other counseling recommendations.  Return in about 1 week (around 01/18/2017).   Felicia Lang, CNM

## 2017-01-12 LAB — CERVICOVAGINAL ANCILLARY ONLY
CHLAMYDIA, DNA PROBE: NEGATIVE
Neisseria Gonorrhea: NEGATIVE

## 2017-01-13 LAB — STREP GP B NAA: STREP GROUP B AG: NEGATIVE

## 2017-01-18 ENCOUNTER — Ambulatory Visit (INDEPENDENT_AMBULATORY_CARE_PROVIDER_SITE_OTHER): Payer: BLUE CROSS/BLUE SHIELD | Admitting: Student

## 2017-01-18 VITALS — BP 123/83 | HR 133 | Wt 179.7 lb

## 2017-01-18 DIAGNOSIS — Z3403 Encounter for supervision of normal first pregnancy, third trimester: Secondary | ICD-10-CM

## 2017-01-18 NOTE — Patient Instructions (Signed)

## 2017-01-18 NOTE — Progress Notes (Signed)
   PRENATAL VISIT NOTE  Subjective:  Felicia Lang is a 26 y.o. G1P0 at 654w4d being seen today for ongoing prenatal care.  She is currently monitored for the following issues for this low-risk pregnancy and has Supervision of low-risk first pregnancy on their problem list.  Patient reports no complaints.  Contractions: Irregular. Vag. Bleeding: None.  Movement: Present. Denies leaking of fluid.   The following portions of the patient's history were reviewed and updated as appropriate: allergies, current medications, past family history, past medical history, past social history, past surgical history and problem list. Problem list updated.  Objective:   Vitals:   01/18/17 0740  BP: 123/83  Pulse: (!) 133  Weight: 179 lb 11.2 oz (81.5 kg)    Fetal Status: Fetal Heart Rate (bpm): 156 Fundal Height: 37 cm Movement: Present     General:  Alert, oriented and cooperative. Patient is in no acute distress.  Skin: Skin is warm and dry. No rash noted.   Cardiovascular: Normal heart rate noted  Respiratory: Normal respiratory effort, no problems with respiration noted  Abdomen: Soft, gravid, appropriate for gestational age.  Pain/Pressure: Absent     Pelvic: Cervical exam deferred        Extremities: Normal range of motion.  Edema: Trace  Mental Status:  Normal mood and affect. Normal behavior. Normal judgment and thought content.   Assessment and Plan:  Pregnancy: G1P0 at 2754w4d  1. Encounter for supervision of low-risk first pregnancy in third trimester -Doing well; reviewed culture results.   Term labor symptoms and general obstetric precautions including but not limited to vaginal bleeding, contractions, leaking of fluid and fetal movement were reviewed in detail with the patient. Please refer to After Visit Summary for other counseling recommendations.  Return in about 1 week (around 01/25/2017).   Marylene LandKathryn Lorraine Kooistra, CNM

## 2017-01-25 ENCOUNTER — Ambulatory Visit (INDEPENDENT_AMBULATORY_CARE_PROVIDER_SITE_OTHER): Payer: BLUE CROSS/BLUE SHIELD | Admitting: Student

## 2017-01-25 VITALS — BP 131/88 | HR 103 | Wt 183.3 lb

## 2017-01-25 DIAGNOSIS — Z3483 Encounter for supervision of other normal pregnancy, third trimester: Secondary | ICD-10-CM

## 2017-01-25 DIAGNOSIS — Z029 Encounter for administrative examinations, unspecified: Secondary | ICD-10-CM

## 2017-01-25 NOTE — Patient Instructions (Signed)
Hypertension During Pregnancy Hypertension is also called high blood pressure. High blood pressure means that the force of your blood moving in your body is too strong. When you are pregnant, this condition should be watched carefully. It can cause problems for you and your baby. Follow these instructions at home: Eating and drinking  Drink enough fluid to keep your pee (urine) clear or pale yellow.  Eat healthy foods that are low in salt (sodium). ? Do not add salt to your food. ? Check labels on foods and drinks to see much salt is in them. Look on the label where you see "Sodium." Lifestyle  Do not use any products that contain nicotine or tobacco, such as cigarettes and e-cigarettes. If you need help quitting, ask your doctor.  Do not use alcohol.  Avoid caffeine.  Avoid stress. Rest and get plenty of sleep. General instructions  Take over-the-counter and prescription medicines only as told by your doctor.  While lying down, lie on your left side. This keeps pressure off your baby.  While sitting or lying down, raise (elevate) your feet. Try putting some pillows under your lower legs.  Exercise regularly. Ask your doctor what kinds of exercise are best for you.  Keep all prenatal and follow-up visits as told by your doctor. This is important. Contact a doctor if:  You have symptoms that your doctor told you to watch for, such as: ? Fever. ? Throwing up (vomiting). ? Headache. Get help right away if:  You have very bad pain in your belly (abdomen).  You are throwing up, and this does not get better with treatment.  You suddenly get swelling in your hands, ankles, or face.  You gain 4 lb (1.8 kg) or more in 1 week.  You get bleeding from your vagina.  You have blood in your pee.  You do not feel your baby moving as much as normal.  You have a change in vision.  You have muscle twitching or sudden tightening (spasms).  You have trouble breathing.  Your lips  or fingernails turn blue. This information is not intended to replace advice given to you by your health care provider. Make sure you discuss any questions you have with your health care provider. Document Released: 04/01/2010 Document Revised: 11/09/2015 Document Reviewed: 11/09/2015 Elsevier Interactive Patient Education  2017 [employer] Inc.  

## 2017-01-26 DIAGNOSIS — Z3490 Encounter for supervision of normal pregnancy, unspecified, unspecified trimester: Secondary | ICD-10-CM | POA: Insufficient documentation

## 2017-01-26 LAB — COMPREHENSIVE METABOLIC PANEL
A/G RATIO: 1.5 (ref 1.2–2.2)
ALT: 13 IU/L (ref 0–32)
AST: 21 IU/L (ref 0–40)
Albumin: 3.9 g/dL (ref 3.5–5.5)
Alkaline Phosphatase: 130 IU/L — ABNORMAL HIGH (ref 39–117)
BILIRUBIN TOTAL: 0.2 mg/dL (ref 0.0–1.2)
BUN/Creatinine Ratio: 7 — ABNORMAL LOW (ref 9–23)
BUN: 4 mg/dL — ABNORMAL LOW (ref 6–20)
CALCIUM: 9.8 mg/dL (ref 8.7–10.2)
CHLORIDE: 101 mmol/L (ref 96–106)
CO2: 20 mmol/L (ref 20–29)
Creatinine, Ser: 0.55 mg/dL — ABNORMAL LOW (ref 0.57–1.00)
GFR, EST AFRICAN AMERICAN: 150 mL/min/{1.73_m2} (ref >59)
GFR, EST NON AFRICAN AMERICAN: 130 mL/min/{1.73_m2} (ref >59)
GLOBULIN, TOTAL: 2.6 g/dL (ref 1.5–4.5)
Glucose: 82 mg/dL (ref 65–99)
POTASSIUM: 4.2 mmol/L (ref 3.5–5.2)
SODIUM: 139 mmol/L (ref 134–144)
TOTAL PROTEIN: 6.5 g/dL (ref 6.0–8.5)

## 2017-01-26 LAB — PROTEIN / CREATININE RATIO, URINE
Creatinine, Urine: 53.8 mg/dL
PROTEIN UR: 14.1 mg/dL
Protein/Creat Ratio: 262 mg/g creat — ABNORMAL HIGH (ref 0–200)

## 2017-01-26 LAB — CBC
HEMOGLOBIN: 11.1 g/dL (ref 11.1–15.9)
Hematocrit: 34.2 % (ref 34.0–46.6)
MCH: 27.6 pg (ref 26.6–33.0)
MCHC: 32.5 g/dL (ref 31.5–35.7)
MCV: 85 fL (ref 79–97)
PLATELETS: 232 10*3/uL (ref 150–379)
RBC: 4.02 x10E6/uL (ref 3.77–5.28)
RDW: 13.4 % (ref 12.3–15.4)
WBC: 9.2 10*3/uL (ref 3.4–10.8)

## 2017-01-26 NOTE — Progress Notes (Signed)
   PRENATAL VISIT NOTE  Subjective:  Felicia Lang is a 26 y.o. G1P0 at 50w5dbeing seen today for ongoing prenatal care.  She is currently monitored for the following issues for this low-risk pregnancy and has Supervision of low-risk first pregnancy on their problem list.  Patient reports no complaints. Patient denies headache, epigastric pain, sudden edema, floating spots or blurry vision. States that she occasionally sees trailing lights but that has been going on for 4 months and have no changed in severity or frequency.   Contractions: Not present. Vag. Bleeding: None.  Movement: Present. Denies leaking of fluid.   The following portions of the patient's history were reviewed and updated as appropriate: allergies, current medications, past family history, past medical history, past social history, past surgical history and problem list. Problem list updated.  Objective:   Vitals:   01/25/17 0747 01/25/17 0756  BP: (!) 141/88 131/88  Pulse: (!) 103   Weight: 183 lb 4.8 oz (83.1 kg)     Fetal Status: Fetal Heart Rate (bpm): 144 Fundal Height: 38 cm Movement: Present     General:  Alert, oriented and cooperative. Patient is in no acute distress.  Skin: Skin is warm and dry. No rash noted.   Cardiovascular: Normal heart rate noted  Respiratory: Normal respiratory effort, no problems with respiration noted  Abdomen: Soft, gravid, appropriate for gestational age.  Pain/Pressure: Absent     Pelvic: Cervical exam deferred        Extremities: Normal range of motion.  Edema: Trace  Mental Status:  Normal mood and affect. Normal behavior. Normal judgment and thought content.   Assessment and Plan:  Pregnancy: G1P0 at 376w5d1. Encounter for supervision of other normal pregnancy in third trimester Patient had one elevated blood pressure immediately upon entering the room, and then had a normal blood pressure immediately after that. Will draw labs to have a baseline. Encouraged patient to  keep appt on Tuesday and to return to [facility] if she experiences sudden headache, vision changes, epigastric pain or sudden swelling. Patient and husband verbalized understanding.  - Protein / creatinine ratio, urine - CBC - Comp Met (CMET)  2. Encounter for supervision of low-risk first pregnancy in third trimester   Term labor symptoms and general obstetric precautions including but not limited to vaginal bleeding, contractions, leaking of fluid and fetal movement were reviewed in detail with the patient. Please refer to After Visit Summary for other counseling recommendations.  No Follow-up on file. PAtient has appt on 01-30-2017.   KaStarr LakeCNM

## 2017-01-30 ENCOUNTER — Ambulatory Visit (INDEPENDENT_AMBULATORY_CARE_PROVIDER_SITE_OTHER): Payer: BLUE CROSS/BLUE SHIELD | Admitting: Medical

## 2017-01-30 ENCOUNTER — Encounter: Payer: Self-pay | Admitting: Medical

## 2017-01-30 VITALS — BP 135/79 | HR 109 | Wt 184.0 lb

## 2017-01-30 DIAGNOSIS — Z3403 Encounter for supervision of normal first pregnancy, third trimester: Secondary | ICD-10-CM

## 2017-01-30 NOTE — Progress Notes (Signed)
   PRENATAL VISIT NOTE  Subjective:  Felicia Lang is a 26 y.o. G1P0 at 6342w2d being seen today for ongoing prenatal care.  She is currently monitored for the following issues for this low-risk pregnancy and has Supervision of low-risk first pregnancy on their problem list.  Patient reports trace edema, resolves with rest. No HA, floaters. .  Contractions: Not present. Vag. Bleeding: None.  Movement: Present. Denies leaking of fluid.   The following portions of the patient's history were reviewed and updated as appropriate: allergies, current medications, past family history, past medical history, past social history, past surgical history and problem list. Problem list updated.  Objective:   Vitals:   01/30/17 0747  BP: 135/79  Pulse: (!) 109  Weight: 184 lb (83.5 kg)    Fetal Status: Fetal Heart Rate (bpm): 142 Fundal Height: 39 cm Movement: Present     General:  Alert, oriented and cooperative. Patient is in no acute distress.  Skin: Skin is warm and dry. No rash noted.   Cardiovascular: Normal heart rate noted  Respiratory: Normal respiratory effort, no problems with respiration noted  Abdomen: Soft, gravid, appropriate for gestational age.  Pain/Pressure: Absent     Pelvic: Cervical exam deferred        Extremities: Normal range of motion.  Edema: Trace  Mental Status:  Normal mood and affect. Normal behavior. Normal judgment and thought content.   Assessment and Plan:  Pregnancy: G1P0 at 2742w2d  1. Encounter for supervision of low-risk first pregnancy in third trimester - Doing well - IOL scheduled for 41 weeks  Term labor symptoms and general obstetric precautions including but not limited to vaginal bleeding, contractions, leaking of fluid and fetal movement were reviewed in detail with the patient. Please refer to After Visit Summary for other counseling recommendations.  Return in about 1 week (around 02/06/2017) for LOB, NST/BPP.   Vonzella NippleJulie Margaretann Abate, PA-C

## 2017-01-30 NOTE — Patient Instructions (Signed)
Fetal Movement Counts °Patient Name: ________________________________________________ Patient Due Date: ____________________ °What is a fetal movement count? °A fetal movement count is the number of times that you feel your baby move during a certain amount of time. This may also be called a fetal kick count. A fetal movement count is recommended for every pregnant woman. You may be asked to start counting fetal movements as early as week 28 of your pregnancy. °Pay attention to when your baby is most active. You may notice your baby's sleep and wake cycles. You may also notice things that make your baby move more. You should do a fetal movement count: °· When your baby is normally most active. °· At the same time each day. ° °A good time to count movements is while you are resting, after having something to eat and drink. °How do I count fetal movements? °1. Find a quiet, comfortable area. Sit, or lie down on your side. °2. Write down the date, the start time and stop time, and the number of movements that you felt between those two times. Take this information with you to your health care visits. °3. For 2 hours, count kicks, flutters, swishes, rolls, and jabs. You should feel at least 10 movements during 2 hours. °4. You may stop counting after you have felt 10 movements. °5. If you do not feel 10 movements in 2 hours, have something to eat and drink. Then, keep resting and counting for 1 hour. If you feel at least 4 movements during that hour, you may stop counting. °Contact a health care provider if: °· You feel fewer than 4 movements in 2 hours. °· Your baby is not moving like he or she usually does. °Date: ____________ Start time: ____________ Stop time: ____________ Movements: ____________ °Date: ____________ Start time: ____________ Stop time: ____________ Movements: ____________ °Date: ____________ Start time: ____________ Stop time: ____________ Movements: ____________ °Date: ____________ Start time:  ____________ Stop time: ____________ Movements: ____________ °Date: ____________ Start time: ____________ Stop time: ____________ Movements: ____________ °Date: ____________ Start time: ____________ Stop time: ____________ Movements: ____________ °Date: ____________ Start time: ____________ Stop time: ____________ Movements: ____________ °Date: ____________ Start time: ____________ Stop time: ____________ Movements: ____________ °Date: ____________ Start time: ____________ Stop time: ____________ Movements: ____________ °This information is not intended to replace advice given to you by your health care provider. Make sure you discuss any questions you have with your health care provider. °Document Released: 03/29/2006 Document Revised: 10/27/2015 Document Reviewed: 04/08/2015 °Elsevier Interactive Patient Education © 2018 [employer] °Braxton Hicks Contractions °Contractions of the uterus can occur throughout pregnancy, but they are not always a sign that you are in labor. You may have practice contractions called Braxton Hicks contractions. These false labor contractions are sometimes confused with true labor. °What are Braxton Hicks contractions? °Braxton Hicks contractions are tightening movements that occur in the muscles of the uterus before labor. Unlike true labor contractions, these contractions do not result in opening (dilation) and thinning of the cervix. Toward the end of pregnancy (32-34 weeks), Braxton Hicks contractions can happen more often and may become stronger. These contractions are sometimes difficult to tell apart from true labor because they can be very uncomfortable. You should not feel embarrassed if you go to the hospital with false labor. °Sometimes, the only way to tell if you are in true labor is for your health care provider to look for changes in the cervix. The health care provider will do a physical exam and may monitor your contractions. If   you are not in true labor, the exam  should show that your cervix is not dilating and your water has not broken. °If there are no prenatal problems or other health problems associated with your pregnancy, it is completely safe for you to be sent home with false labor. You may continue to have Braxton Hicks contractions until you go into true labor. °How can I tell the difference between true labor and false labor? °· Differences °? False labor °? Contractions last 30-70 seconds.: Contractions are usually shorter and not as strong as true labor contractions. °? Contractions become very regular.: Contractions are usually irregular. °? Discomfort is usually felt in the top of the uterus, and it spreads to the lower abdomen and low back.: Contractions are often felt in the front of the lower abdomen and in the groin. °? Contractions do not go away with walking.: Contractions may go away when you walk around or change positions while lying down. °? Contractions usually become more intense and increase in frequency.: Contractions get weaker and are shorter-lasting as time goes on. °? The cervix dilates and gets thinner.: The cervix usually does not dilate or become thin. °Follow these instructions at home: °· Take over-the-counter and prescription medicines only as told by your health care provider. °· Keep up with your usual exercises and follow other instructions from your health care provider. °· Eat and drink lightly if you think you are going into labor. °· If Braxton Hicks contractions are making you uncomfortable: °? Change your position from lying down or resting to walking, or change from walking to resting. °? Sit and rest in a tub of warm water. °? Drink enough fluid to keep your urine clear or pale yellow. Dehydration may cause these contractions. °? Do slow and deep breathing several times an hour. °· Keep all follow-up prenatal visits as told by your health care provider. This is important. °Contact a health care provider if: °· You have a  fever. °· You have continuous pain in your abdomen. °Get help right away if: °· Your contractions become stronger, more regular, and closer together. °· You have fluid leaking or gushing from your vagina. °· You pass blood-tinged mucus (bloody show). °· You have bleeding from your vagina. °· You have low back pain that you never had before. °· You feel your baby’s head pushing down and causing pelvic pressure. °· Your baby is not moving inside you as much as it used to. °Summary °· Contractions that occur before labor are called Braxton Hicks contractions, false labor, or practice contractions. °· Braxton Hicks contractions are usually shorter, weaker, farther apart, and less regular than true labor contractions. True labor contractions usually become progressively stronger and regular and they become more frequent. °· Manage discomfort from Braxton Hicks contractions by changing position, resting in a warm bath, drinking plenty of water, or practicing deep breathing. °This information is not intended to replace advice given to you by your health care provider. Make sure you discuss any questions you have with your health care provider. °Document Released: 02/27/2005 Document Revised: 01/17/2016 Document Reviewed: 01/17/2016 °Elsevier Interactive Patient Education © 2017 [employer] Inc. ° °

## 2017-02-05 ENCOUNTER — Telehealth (HOSPITAL_COMMUNITY): Payer: Self-pay | Admitting: *Deleted

## 2017-02-05 NOTE — Telephone Encounter (Signed)
Preadmission screen  

## 2017-02-06 ENCOUNTER — Encounter: Payer: BLUE CROSS/BLUE SHIELD | Admitting: Medical

## 2017-02-06 ENCOUNTER — Other Ambulatory Visit: Payer: BLUE CROSS/BLUE SHIELD

## 2017-02-07 ENCOUNTER — Ambulatory Visit (INDEPENDENT_AMBULATORY_CARE_PROVIDER_SITE_OTHER): Payer: BLUE CROSS/BLUE SHIELD | Admitting: Obstetrics & Gynecology

## 2017-02-07 ENCOUNTER — Encounter: Payer: Self-pay | Admitting: Obstetrics & Gynecology

## 2017-02-07 ENCOUNTER — Ambulatory Visit (INDEPENDENT_AMBULATORY_CARE_PROVIDER_SITE_OTHER): Payer: BLUE CROSS/BLUE SHIELD | Admitting: *Deleted

## 2017-02-07 ENCOUNTER — Ambulatory Visit: Payer: Self-pay

## 2017-02-07 VITALS — BP 138/83 | HR 98 | Wt 186.5 lb

## 2017-02-07 DIAGNOSIS — Z3403 Encounter for supervision of normal first pregnancy, third trimester: Secondary | ICD-10-CM

## 2017-02-07 DIAGNOSIS — O480 Post-term pregnancy: Secondary | ICD-10-CM

## 2017-02-07 NOTE — Progress Notes (Signed)

## 2017-02-07 NOTE — Progress Notes (Signed)
Pt reports frequent UC's last night and desires Cx exam today.  IOL scheduled on 12/2

## 2017-02-07 NOTE — Progress Notes (Signed)
   PRENATAL VISIT NOTE  Subjective:  Alphonzo Lemmingsmanda D Steedman is a 26 y.o. G1P0 at 462w3d being seen today for ongoing prenatal care.  She is currently monitored for the following issues for this low-risk pregnancy and has Supervision of low-risk first pregnancy on their problem list.  Patient reports freq contractions since yesterday. None currently. .  Contractions: Irregular. Vag. Bleeding: None.  Movement: Present. Denies leaking of fluid.   The following portions of the patient's history were reviewed and updated as appropriate: allergies, current medications, past family history, past medical history, past social history, past surgical history and problem list. Problem list updated.  Objective:   Vitals:   02/07/17 1004  BP: 138/83  Pulse: 98  Weight: 186 lb 8 oz (84.6 kg)    Fetal Status: Fetal Heart Rate (bpm): NST   Movement: Present     General:  Alert, oriented and cooperative. Patient is in no acute distress.  Skin: Skin is warm and dry. No rash noted.   Cardiovascular: Normal heart rate noted  Respiratory: Normal respiratory effort, no problems with respiration noted  Abdomen: Soft, gravid, appropriate for gestational age.  Pain/Pressure: Present     Pelvic: Cervical exam performed        Extremities: Normal range of motion.  Edema: Trace  Mental Status:  Normal mood and affect. Normal behavior. Normal judgment and thought content.   Assessment and Plan:  Pregnancy: G1P0 at [redacted]w[redacted]d  1. Encounter for supervision of low-risk first pregnancy in third trimester Scheduled for IOL on 12/4. She is probably in latent phase labor currently.     2. Post term pregnancy, antepartum condition or complication BPP 10/10  NST reviewed and reactive.  Term labor symptoms and general obstetric precautions including but not limited to vaginal bleeding, contractions, leaking of fluid and fetal movement were reviewed in detail with the patient. Please refer to After Visit Summary for other  counseling recommendations.  Return in about 5 weeks (around 03/14/2017) for PP visit.  IOL on 12/2.   Willodean Rosenthalarolyn Harraway-Smith, MD

## 2017-02-08 ENCOUNTER — Encounter (HOSPITAL_COMMUNITY): Payer: Self-pay | Admitting: Certified Registered Nurse Anesthetist

## 2017-02-08 ENCOUNTER — Encounter (HOSPITAL_COMMUNITY): Payer: Self-pay | Admitting: Emergency Medicine

## 2017-02-08 ENCOUNTER — Inpatient Hospital Stay (HOSPITAL_COMMUNITY): Payer: BLUE CROSS/BLUE SHIELD | Admitting: Anesthesiology

## 2017-02-08 ENCOUNTER — Inpatient Hospital Stay (HOSPITAL_COMMUNITY)
Admission: AD | Admit: 2017-02-08 | Discharge: 2017-02-10 | DRG: 806 | Disposition: A | Discharge status: Home / Self Care | Payer: BLUE CROSS/BLUE SHIELD | Source: Ambulatory Visit | Attending: Obstetrics & Gynecology | Admitting: Obstetrics & Gynecology

## 2017-02-08 DIAGNOSIS — Z3A40 40 weeks gestation of pregnancy: Secondary | ICD-10-CM | POA: Diagnosis not present

## 2017-02-08 DIAGNOSIS — O9902 Anemia complicating childbirth: Secondary | ICD-10-CM | POA: Diagnosis present

## 2017-02-08 DIAGNOSIS — Z3483 Encounter for supervision of other normal pregnancy, third trimester: Secondary | ICD-10-CM | POA: Diagnosis present

## 2017-02-08 DIAGNOSIS — D649 Anemia, unspecified: Secondary | ICD-10-CM | POA: Diagnosis present

## 2017-02-08 HISTORY — DX: Other specified health status: Z78.9

## 2017-02-08 LAB — CBC
HCT: 34 % — ABNORMAL LOW (ref 36.0–46.0)
Hemoglobin: 11 g/dL — ABNORMAL LOW (ref 12.0–15.0)
MCH: 26.9 pg (ref 26.0–34.0)
MCHC: 32.4 g/dL (ref 30.0–36.0)
MCV: 83.1 fL (ref 78.0–100.0)
PLATELETS: 210 10*3/uL (ref 150–400)
RBC: 4.09 MIL/uL (ref 3.87–5.11)
RDW: 13.6 % (ref 11.5–15.5)
WBC: 12.6 10*3/uL — AB (ref 4.0–10.5)

## 2017-02-08 LAB — ABO/RH: ABO/RH(D): A POS

## 2017-02-08 LAB — TYPE AND SCREEN
ABO/RH(D): A POS
Antibody Screen: NEGATIVE

## 2017-02-08 LAB — RPR: RPR Ser Ql: NONREACTIVE

## 2017-02-08 MED ORDER — PHENYLEPHRINE 40 MCG/ML (10ML) SYRINGE FOR IV PUSH (FOR BLOOD PRESSURE SUPPORT)
80.0000 ug | PREFILLED_SYRINGE | INTRAVENOUS | Status: DC | PRN
  Filled 2017-02-08: qty 5
  Filled 2017-02-08: qty 10

## 2017-02-08 MED ORDER — LACTATED RINGERS IV SOLN
INTRAVENOUS | Status: DC
  Administered 2017-02-08 (×4): via INTRAVENOUS

## 2017-02-08 MED ORDER — TERBUTALINE SULFATE 1 MG/ML IJ SOLN
0.2500 mg | Freq: Once | INTRAMUSCULAR | Status: DC | PRN
  Filled 2017-02-08: qty 1

## 2017-02-08 MED ORDER — OXYCODONE-ACETAMINOPHEN 5-325 MG PO TABS: 1.0000 | ORAL_TABLET | ORAL | Status: DC | PRN

## 2017-02-08 MED ORDER — FLEET ENEMA 7-19 GM/118ML RE ENEM: 1.0000 | ENEMA | RECTAL | Status: DC | PRN

## 2017-02-08 MED ORDER — ONDANSETRON HCL 4 MG/2ML IJ SOLN
4.0000 mg | Freq: Four times a day (QID) | INTRAMUSCULAR | Status: DC | PRN
  Administered 2017-02-08: 4 mg via INTRAVENOUS
  Filled 2017-02-08: qty 2

## 2017-02-08 MED ORDER — OXYTOCIN BOLUS FROM INFUSION
500.0000 mL | Freq: Once | INTRAVENOUS | Status: AC
  Administered 2017-02-08: 500 mL via INTRAVENOUS

## 2017-02-08 MED ORDER — OXYTOCIN 40 UNITS IN LACTATED RINGERS INFUSION - SIMPLE MED: 2.5000 [IU]/h | INTRAVENOUS | Status: DC

## 2017-02-08 MED ORDER — OXYTOCIN 40 UNITS IN LACTATED RINGERS INFUSION - SIMPLE MED
1.0000 m[IU]/min | INTRAVENOUS | Status: DC
  Administered 2017-02-08: 2 m[IU]/min via INTRAVENOUS
  Filled 2017-02-08: qty 1000

## 2017-02-08 MED ORDER — FENTANYL CITRATE (PF) 100 MCG/2ML IJ SOLN
100.0000 ug | INTRAMUSCULAR | Status: DC | PRN
  Administered 2017-02-08: 100 ug via INTRAVENOUS
  Filled 2017-02-08: qty 2

## 2017-02-08 MED ORDER — EPHEDRINE 5 MG/ML INJ
10.0000 mg | INTRAVENOUS | Status: DC | PRN
  Filled 2017-02-08: qty 2

## 2017-02-08 MED ORDER — FENTANYL 2.5 MCG/ML BUPIVACAINE 1/10 % EPIDURAL INFUSION (WH - ANES)
14.0000 mL/h | INTRAMUSCULAR | Status: DC | PRN
  Administered 2017-02-08 (×2): 14 mL/h via EPIDURAL
  Filled 2017-02-08 (×2): qty 100

## 2017-02-08 MED ORDER — SOD CITRATE-CITRIC ACID 500-334 MG/5ML PO SOLN: 30.0000 mL | ORAL | Status: DC | PRN

## 2017-02-08 MED ORDER — DIPHENHYDRAMINE HCL 50 MG/ML IJ SOLN: 12.5000 mg | INTRAMUSCULAR | Status: DC | PRN

## 2017-02-08 MED ORDER — ERYTHROMYCIN 5 MG/GM OP OINT
TOPICAL_OINTMENT | OPHTHALMIC | Status: AC
  Filled 2017-02-08: qty 1

## 2017-02-08 MED ORDER — LACTATED RINGERS IV SOLN
500.0000 mL | Freq: Once | INTRAVENOUS | Status: AC
  Administered 2017-02-08: 500 mL via INTRAVENOUS

## 2017-02-08 MED ORDER — OXYCODONE-ACETAMINOPHEN 5-325 MG PO TABS: 2.0000 | ORAL_TABLET | ORAL | Status: DC | PRN

## 2017-02-08 MED ORDER — LIDOCAINE HCL (PF) 1 % IJ SOLN
30.0000 mL | INTRAMUSCULAR | Status: DC | PRN
  Administered 2017-02-08: 30 mL via SUBCUTANEOUS
  Filled 2017-02-08: qty 30

## 2017-02-08 MED ORDER — ACETAMINOPHEN 325 MG PO TABS: 650.0000 mg | ORAL_TABLET | ORAL | Status: DC | PRN

## 2017-02-08 MED ORDER — PHENYLEPHRINE 40 MCG/ML (10ML) SYRINGE FOR IV PUSH (FOR BLOOD PRESSURE SUPPORT)
80.0000 ug | PREFILLED_SYRINGE | INTRAVENOUS | Status: DC | PRN
  Filled 2017-02-08: qty 5

## 2017-02-08 MED ORDER — LACTATED RINGERS IV SOLN
500.0000 mL | INTRAVENOUS | Status: DC | PRN
  Administered 2017-02-08: 500 mL via INTRAVENOUS

## 2017-02-08 MED ORDER — LIDOCAINE HCL (PF) 1 % IJ SOLN
INTRAMUSCULAR | Status: DC | PRN
  Administered 2017-02-08 (×2): 4 mL via EPIDURAL

## 2017-02-08 NOTE — Progress Notes (Signed)
Felicia Lang is a 26 y.o. G1P0 at 2664w4d.  Subjective: Patient is comfortable with epidural and requesting AROM.      Objective: BP (!) 143/93   Pulse 94   Temp 98.1 F (36.7 C) (Oral)   Resp 18   Ht 5\' 3"  (1.6 m)   Wt 186 lb (84.4 kg)   LMP 04/30/2016 (Exact Date)   SpO2 98%   BMI 32.95 kg/m    FHT:  FHR: 125 bpm, variability: appropriate,  accelerations:  15x15,  decelerations:  none UC:   Q 2-194minutes, 60-80 Dilation: 6 Effacement (%): 90 Cervical Position: Anterior Station: -2 Presentation: Vertex Exam by:: Felicia Lang. Felicia Lang   Labs: Results for orders placed or performed during the hospital encounter of 02/08/17 (from the past 24 hour(s))  CBC     Status: Abnormal   Collection Time: 02/08/17  6:00 AM  Result Value Ref Range   WBC 12.6 (H) 4.0 - 10.5 K/uL   RBC 4.09 3.87 - 5.11 MIL/uL   Hemoglobin 11.0 (L) 12.0 - 15.0 g/dL   HCT 16.134.0 (L) 09.636.0 - 04.546.0 %   MCV 83.1 78.0 - 100.0 fL   MCH 26.9 26.0 - 34.0 pg   MCHC 32.4 30.0 - 36.0 g/dL   RDW 40.913.6 81.111.5 - 91.415.5 %   Platelets 210 150 - 400 K/uL  [facility] and [facility]     Status: None   Collection Time: 02/08/17  6:00 AM  Result Value Ref Range   ABO/RH(D) A POS    Antibody Screen NEG    Sample Expiration 02/11/2017     Assessment / Plan: AROM @0945  5464w4d week IUP Labor: pitocin 2x2, s/p AROM Fetal Wellbeing:  Category 1 Pain Control:  Epidural Anticipated MOD:  nsvd  Felicia Lang, Felicia Dobbins, DO 02/08/2017 10:24 AM

## 2017-02-08 NOTE — Anesthesia Procedure Notes (Signed)
Epidural Patient location during procedure: OB Start time: 02/08/2017 9:09 AM  Staffing Anesthesiologist: Leonides GrillsEllender, Ryan P, MD Performed: anesthesiologist   Preanesthetic Checklist Completed: patient identified, site marked, pre-op evaluation, timeout performed, IV checked, risks and benefits discussed and monitors and equipment checked  Epidural Patient position: sitting Prep: DuraPrep Patient monitoring: heart rate, cardiac monitor, continuous pulse ox and blood pressure Approach: midline Location: L4-L5 Injection technique: LOR air  Needle:  Needle type: Tuohy  Needle gauge: 17 G Needle length: 9 cm Needle insertion depth: 5 cm Catheter type: closed end flexible Catheter size: 19 Gauge Catheter at skin depth: 10 cm Test dose: negative and Other  Assessment Events: blood not aspirated, injection not painful, no injection resistance and negative IV test  Additional Notes Informed consent obtained prior to proceeding including risk of failure, 1% risk of PDPH, risk of minor discomfort and bruising. Discussed alternatives to epidural analgesia and patient desires to proceed.  Timeout performed pre-procedure verifying patient name, procedure, and platelet count.  Patient tolerated procedure well. Reason for block:procedure for pain

## 2017-02-08 NOTE — Anesthesia Preprocedure Evaluation (Signed)
Anesthesia Evaluation  Patient identified by MRN, date of birth, ID band Patient awake    Reviewed: Allergy & Precautions, H&P , NPO status , Patient's Chart, lab work & pertinent test results  History of Anesthesia Complications Negative for: history of anesthetic complications  Airway Mallampati: II  TM Distance: >3 FB Neck ROM: full    Dental no notable dental hx. (+) Teeth Intact   Pulmonary neg pulmonary ROS,    Pulmonary exam normal breath sounds clear to auscultation       Cardiovascular negative cardio ROS Normal cardiovascular exam Rhythm:regular Rate:Normal     Neuro/Psych negative neurological ROS  negative psych ROS   GI/Hepatic negative GI ROS, Neg liver ROS,   Endo/Other  negative endocrine ROS  Renal/GU negative Renal ROS  negative genitourinary   Musculoskeletal   Abdominal (+) + obese,   Peds  Hematology  (+) anemia ,   Anesthesia Other Findings   Reproductive/Obstetrics (+) Pregnancy                             Anesthesia Physical Anesthesia Plan  ASA: II  Anesthesia Plan: Epidural   Post-op Pain Management:    Induction:   PONV Risk Score and Plan:   Airway Management Planned:   Additional Equipment:   Intra-op Plan:   Post-operative Plan:   Informed Consent: I have reviewed the patients History and Physical, chart, labs and discussed the procedure including the risks, benefits and alternatives for the proposed anesthesia with the patient or authorized representative who has indicated his/her understanding and acceptance.     Plan Discussed with:   Anesthesia Plan Comments:         Anesthesia Quick Evaluation  

## 2017-02-08 NOTE — Anesthesia Pain Management Evaluation Note (Signed)
  CRNA Pain Management Visit Note  Patient: Felicia Lang, 26 y.o., female  "Hello I am a member of the anesthesia team at [facility]. We have an anesthesia team available at all times to provide care throughout the hospital, including epidural management and anesthesia for C-section. I don't know your plan for the delivery whether it a natural birth, water birth, IV sedation, nitrous supplementation, doula or epidural, but we want to meet your pain goals."   1.Was your pain managed to your expectations on prior hospitalizations?   No prior hospitalizations  2.What is your expectation for pain management during this hospitalization?     Epidural  3.How can we help you reach that goal?unsure  Record the patient's initial score and the patient's pain goal.   Pain: 7  Pain Goal: 7 [facility] wants you to be able to say your pain was always managed very well.  Felicia Lang,Felicia Lang 02/08/2017

## 2017-02-08 NOTE — Progress Notes (Signed)
Vitals:   02/08/17 2000 02/08/17 2030  BP: 132/86 130/69  Pulse: (!) 116 (!) 105  Resp:    Temp:    SpO2:     Has been pushing since 1930, baby at +2-+3.  FHR w/occ variable decel, now Cat 1.  Anticipate SVD soon.

## 2017-02-08 NOTE — MAU Note (Signed)
PT SAYS UC STARTED STRONG AT 0230.  PNC- WITH CLINIC.  VE YESTERDAY  3 CM  . GBS- NEG

## 2017-02-08 NOTE — Progress Notes (Signed)
Felicia Lang is a 26 y.o. G1P0 at 5584w4d.  Subjective: Patient is doing well now s/p AROM, denies any pain. She is able to feel contractions but states they are not painful.   Objective: BP 135/77   Pulse 87   Temp 98.1 F (36.7 C) (Oral)   Resp 18   Ht 5\' 3"  (1.6 m)   Wt 84.4 kg (186 lb)   LMP 04/30/2016 (Exact Date)   SpO2 98%   BMI 32.95 kg/m    FHT:  FHR: 125bpm, variability: moderate,  accelerations:  15 x 15,  decelerations:  none UC:   Q 2-3 minutes, 90-120 Dilation: 6 Effacement (%): 90 Cervical Position: Anterior Station: -2 Presentation: Vertex Exam by:: AWallace Cullens. Gray RN   Labs: Results for orders placed or performed during the hospital encounter of 02/08/17 (from the past 24 hour(s))  CBC     Status: Abnormal   Collection Time: 02/08/17  6:00 AM  Result Value Ref Range   WBC 12.6 (H) 4.0 - 10.5 K/uL   RBC 4.09 3.87 - 5.11 MIL/uL   Hemoglobin 11.0 (L) 12.0 - 15.0 g/dL   HCT 81.134.0 (L) 91.436.0 - 78.246.0 %   MCV 83.1 78.0 - 100.0 fL   MCH 26.9 26.0 - 34.0 pg   MCHC 32.4 30.0 - 36.0 g/dL   RDW 95.613.6 21.311.5 - 08.615.5 %   Platelets 210 150 - 400 K/uL  Type and screen [facility]      Status: None   Collection Time: 02/08/17  6:00 AM  Result Value Ref Range   ABO/RH(D) A POS    Antibody Screen NEG    Sample Expiration 02/11/2017     Assessment / Plan: Felicia Lang is a 26 y.o. G1P0 at 6884w4d here for active labor.   Labor: s/p AROM. Pitocin 2 x 2 Fetal Wellbeing:  Category 1 Pain Control:  epidural Anticipated MOD:  nsvd  Francie MassingBrown, Shalina Norfolk, Medical Student 02/08/2017 11:23 AM

## 2017-02-08 NOTE — H&P (Signed)
LABOR AND DELIVERY ADMISSION HISTORY AND PHYSICAL NOTE  Felicia Lang is a 26 y.o. female G1P0 with IUP at 673w4d by U/S presenting for severe contractions since 0230. Patient reports feeling contractions about every 10 minutes that have lessened in severity since this morning.  Patient was seen yesterday in clinic with x2 days of contractions then that were of a more mild nature.   She reports positive fetal movement and slight vaginal bleeding since yesterday after her cervical check. She denies leakage of fluid.   Prenatal History/Complications[facility]   Past Medical History: Past Medical History:  Diagnosis Date  . Medical history non-contributory     Past Surgical History: Past Surgical History:  Procedure Laterality Date  . NO PAST SURGERIES      Obstetrical History: OB History    Gravida Para Term Preterm AB Living   1             SAB TAB Ectopic Multiple Live Births                  Social History: Social History   Socioeconomic History  . Marital status: Single    Spouse name: None  . Number of children: None  . Years of education: None  . Highest education level: None  Social Needs  . Financial resource strain: None  . Food insecurity - worry: None  . Food insecurity - inability: None  . Transportation needs - medical: None  . Transportation needs - non-medical: None  Occupational History  . None  Tobacco Use  . Smoking status: Never Smoker  . Smokeless tobacco: Never Used  Substance and Sexual Activity  . Alcohol use: No  . Drug use: No  . Sexual activity: Yes    Birth control/protection: None  Other Topics Concern  . None  Social History Narrative  . None    Family History: Family History  Problem Relation Age of Onset  . Hypertension Maternal Grandmother   . COPD Maternal Grandfather     Allergies: No Known Allergies  Medications Prior to Admission  Medication Sig Dispense Refill Last Dose  . Prenatal  Vit-Fe Fumarate-FA (MULTIVITAMIN-PRENATAL) 27-0.8 MG TABS tablet Take 1 tablet by mouth daily at 12 noon.   02/07/2017 at Unknown time     Review of Systems   All systems reviewed and negative except as stated in HPI  Blood pressure (!) 142/85, pulse 97, temperature 98 F (36.7 C), resp. rate 17, height 5\' 3"  (1.6 m), weight 84.4 kg (186 lb), last menstrual period 04/30/2016, SpO2 100 %. General appearance: alert, cooperative and no distress Lungs: clear to auscultation bilaterally Heart: regular rate Abdomen: soft, non-tender, appropriately gravid  Extremities: No calf swelling or tenderness Presentation: cephalic Fetal monitoring: HR 562130, moderate variability, + accels, no decels  Uterine activity: ctx q 1-4 minutes Dilation: 5.5 Effacement (%): 80 Station: -2 Exam by:: lauren fields   Prenatal labs: ABO, Rh: A/Positive/-- (05/22 13080952) Antibody: Negative (05/22 0952) Rubella: 1.30 (05/22 0952) RPR: Non Reactive (08/23 0958)  HBsAg: Negative (05/22 0952)  HIV:   Non-reactive (08/23 [id number])  GBS: Negative (11/01 0824)  1 hr Glucola: WNL (73, 116, 61) Genetic screening:  AFP normal Anatomy US: Normal   Prenatal Transfer Tool  Maternal Diabetes: No Genetic Screening: Normal Maternal Ultrasounds/Referrals: Normal Fetal Ultrasounds or other Referrals:  None Maternal Substance Abuse:  No Significant Maternal Medications:  None Significant Maternal Lab Results: Lab values include: Group B Strep  negative  Results for orders placed or performed during the hospital encounter of 02/08/17 (from the past 24 hour(s))  CBC   Collection Time: 02/08/17  6:00 AM  Result Value Ref Range   WBC 12.6 (H) 4.0 - 10.5 K/uL   RBC 4.09 3.87 - 5.11 MIL/uL   Hemoglobin 11.0 (L) 12.0 - 15.0 g/dL   HCT 16.134.0 (L) 09.636.0 - 04.546.0 %   MCV 83.1 78.0 - 100.0 fL   MCH 26.9 26.0 - 34.0 pg   MCHC 32.4 30.0 - 36.0 g/dL   RDW 40.913.6 81.111.5 - 91.415.5 %   Platelets 210 150 - 400 K/uL    Patient Active Problem  List   Diagnosis Date Noted  . Supervision of low-risk first pregnancy 08/01/2016    Assessment: Felicia Lang is a 26 y.o. G1P0 at 7531w4d here for contractions severe contractions that started this morning following mild contractions x2 days prior.   #Labor: Expectant management.  #Pain: Epidural  #FWB: Cat 1 #ID:  GBS neg #MOF: Breast #MOC: Nexplanon #Circ:  Does not know sex of baby. Undecided about circ if boy.   Felisa BonierBianca Seivley 02/08/2017, 6:57 AM  OB FELLOW MEDICAL STUDENT NOTE ATTESTATION I confirm that I have verified the information documented in the medical student's note and that I have also personally performed the physical exam and all medical decision making activities.  --SOL in low risk patient. Expectant management for now. Consider AROM for augmentation --FHT Cat I  Frederik PearJulie P Kailany Dinunzio, MD OB Fellow 02/08/2017, 7:44 AM

## 2017-02-09 ENCOUNTER — Encounter (HOSPITAL_COMMUNITY): Payer: Self-pay

## 2017-02-09 ENCOUNTER — Other Ambulatory Visit: Payer: Self-pay

## 2017-02-09 DIAGNOSIS — Z3A40 40 weeks gestation of pregnancy: Secondary | ICD-10-CM

## 2017-02-09 MED ORDER — FLEET ENEMA 7-19 GM/118ML RE ENEM: 1.0000 | ENEMA | Freq: Every day | RECTAL | Status: DC | PRN

## 2017-02-09 MED ORDER — DIBUCAINE 1 % RE OINT: 1.0000 "application " | TOPICAL_OINTMENT | RECTAL | Status: DC | PRN

## 2017-02-09 MED ORDER — WITCH HAZEL-GLYCERIN EX PADS: 1.0000 "application " | MEDICATED_PAD | CUTANEOUS | Status: DC | PRN

## 2017-02-09 MED ORDER — ZOLPIDEM TARTRATE 5 MG PO TABS: 5.0000 mg | ORAL_TABLET | Freq: Every evening | ORAL | Status: DC | PRN

## 2017-02-09 MED ORDER — MEASLES, MUMPS & RUBELLA VAC ~~LOC~~ INJ
0.5000 mL | INJECTION | Freq: Once | SUBCUTANEOUS | Status: DC
  Filled 2017-02-09: qty 0.5

## 2017-02-09 MED ORDER — TETANUS-DIPHTH-ACELL PERTUSSIS 5-2.5-18.5 LF-MCG/0.5 IM SUSP: 0.5000 mL | Freq: Once | INTRAMUSCULAR | Status: DC

## 2017-02-09 MED ORDER — OXYCODONE HCL 5 MG PO TABS: 10.0000 mg | ORAL_TABLET | ORAL | Status: DC | PRN

## 2017-02-09 MED ORDER — METHYLERGONOVINE MALEATE 0.2 MG/ML IJ SOLN: 0.2000 mg | INTRAMUSCULAR | Status: DC | PRN

## 2017-02-09 MED ORDER — DOCUSATE SODIUM 100 MG PO CAPS
100.0000 mg | ORAL_CAPSULE | Freq: Two times a day (BID) | ORAL | Status: DC
  Administered 2017-02-09 – 2017-02-10 (×2): 100 mg via ORAL
  Filled 2017-02-09 (×2): qty 1

## 2017-02-09 MED ORDER — ONDANSETRON HCL 4 MG/2ML IJ SOLN: 4.0000 mg | INTRAMUSCULAR | Status: DC | PRN

## 2017-02-09 MED ORDER — OXYCODONE HCL 5 MG PO TABS: 5.0000 mg | ORAL_TABLET | ORAL | Status: DC | PRN

## 2017-02-09 MED ORDER — ONDANSETRON HCL 4 MG PO TABS: 4.0000 mg | ORAL_TABLET | ORAL | Status: DC | PRN

## 2017-02-09 MED ORDER — BISACODYL 10 MG RE SUPP
10.0000 mg | Freq: Every day | RECTAL | Status: DC | PRN
Start: 2017-02-09 — End: 2017-02-10

## 2017-02-09 MED ORDER — COCONUT OIL OIL: 1.0000 "application " | TOPICAL_OIL | Status: DC | PRN

## 2017-02-09 MED ORDER — METHYLERGONOVINE MALEATE 0.2 MG PO TABS: 0.2000 mg | ORAL_TABLET | ORAL | Status: DC | PRN

## 2017-02-09 MED ORDER — PRENATAL MULTIVITAMIN CH
1.0000 | ORAL_TABLET | Freq: Every day | ORAL | Status: DC
  Administered 2017-02-09 – 2017-02-10 (×2): 1 via ORAL
  Filled 2017-02-09 (×2): qty 1

## 2017-02-09 MED ORDER — DIPHENHYDRAMINE HCL 25 MG PO CAPS: 25.0000 mg | ORAL_CAPSULE | Freq: Four times a day (QID) | ORAL | Status: DC | PRN

## 2017-02-09 MED ORDER — IBUPROFEN 600 MG PO TABS
600.0000 mg | ORAL_TABLET | Freq: Four times a day (QID) | ORAL | Status: DC
  Administered 2017-02-09 – 2017-02-10 (×6): 600 mg via ORAL
  Filled 2017-02-09 (×5): qty 1

## 2017-02-09 MED ORDER — BENZOCAINE-MENTHOL 20-0.5 % EX AERO
1.0000 "application " | INHALATION_SPRAY | CUTANEOUS | Status: DC | PRN
  Administered 2017-02-09: 1 via TOPICAL
  Filled 2017-02-09: qty 56

## 2017-02-09 MED ORDER — FERROUS SULFATE 325 (65 FE) MG PO TABS
325.0000 mg | ORAL_TABLET | Freq: Two times a day (BID) | ORAL | Status: DC
  Administered 2017-02-09 (×2): 325 mg via ORAL
  Filled 2017-02-09 (×3): qty 1

## 2017-02-09 MED ORDER — ACETAMINOPHEN 325 MG PO TABS: 650.0000 mg | ORAL_TABLET | ORAL | Status: DC | PRN

## 2017-02-09 MED ORDER — SIMETHICONE 80 MG PO CHEW: 80.0000 mg | CHEWABLE_TABLET | ORAL | Status: DC | PRN

## 2017-02-09 NOTE — Anesthesia Postprocedure Evaluation (Signed)
Anesthesia Post Note  Patient: Felicia Lang  Procedure(s) Performed: AN AD HOC LABOR EPIDURAL     Patient location during evaluation: Mother Baby Anesthesia Type: Epidural Level of consciousness: awake and alert, oriented and patient cooperative Pain management: pain level controlled Vital Signs Assessment: post-procedure vital signs reviewed and stable Respiratory status: spontaneous breathing Cardiovascular status: stable Postop Assessment: no headache, epidural receding, patient able to bend at knees and no signs of nausea or vomiting Anesthetic complications: no Comments: Pain score 2.    Last Vitals:  Vitals:   02/09/17 0345 02/09/17 0555  BP: 120/64 (!) 115/53  Pulse: 100 (!) 105  Resp: 18 18  Temp: 36.7 C 36.8 C  SpO2:      Last Pain:  Vitals:   02/09/17 0556  TempSrc:   PainSc: 2    Pain Goal: Patients Stated Pain Goal: 7 (02/08/17 0747)               Merrilyn PumaWRINKLE,[name]

## 2017-02-09 NOTE — Progress Notes (Signed)
POSTPARTUM PROGRESS NOTE  Post Partum Day 1 Subjective:  Felicia Lang is a 26 y.o. G1P1001 4025w4d s/p vacuum-assisted vaginal delivery.  No acute events overnight. Patient gave birth late last night and developed swelling primarily in her face and vulva, which she believes is improving. Ice was used to help reduce the vulvar swelling overnight.  Pt experiences urge to urinate but has been unable to void. She was in-and-out catheterized twice, yielding 700 and 750cc. This morning she was able to dribble urine. She denies nausea or vomiting.  Pain is well controlled.  She has not had flatus. She has not had bowel movement.  Lochia moderate, somewhat heavier than her normal monthly period. Not having difficulty ambulating.   Objective: Blood pressure (!) 115/53, pulse (!) 105, temperature 98.2 F (36.8 C), temperature source Oral, resp. rate 18, height 5\' 3"  (1.6 m), weight 84.4 kg (186 lb), last menstrual period 04/30/2016, SpO2 99 %, unknown if currently breastfeeding.  Physical Exam:  General: alert, cooperative and no distress Face: Edematous throughout Lochia: normal flow, somewhat heavier than her normal period Chest: no respiratory distress Heart:regular rate, distal pulses intact Abdomen: soft, nontender Uterine Fundus: firm, appropriately tender GU: Labial swelling, Upper right labia slightly swollen; lower left labia markedly swollen; non-tender to the touch, warm and dry to the touch, no signs of hematoma.  DVT Evaluation: No calf swelling or tenderness Extremities: LE edema present, reduced from this morning  Recent Labs    02/08/17 0600  HGB 11.0*  HCT 34.0*    Assessment/Plan:  ASSESSMENT: Felicia Lang is a 26 y.o. G1P1001 7025w4d s/p vacuum-assisted vaginal delivery who is doing well though experiencing difficulty voiding and some discomfort due to swelling post-labor. Difficulty voiding is likely secondary to the epidural as the patient's swelling is more posterior to  the urethra, though swelling may be contributing as well. A foley catheter was placed to reduce pressure as the swelling continues to resolve. We will plan for a trial of void tomorrow tonight, or earlier if the swelling appears to be improved. If patient develops acute pain or swelling does not continue to improve, we will reassess for hematoma.   Plan for discharge tomorrow if able to void and hematoma does not develop.    LOS: 1 day   Francie MassingMelissa  Renk, MS3 02/09/2017, 9:42 AM    I confirm that I have verified the information documented in the student's note and that I have also personally reperformed the physical exam and all medical decision making activities.  Luna KitchensKathryn Neeva Trew CNM

## 2017-02-09 NOTE — Progress Notes (Signed)
INTERIM POSTPARTUM PROGRESS NOTE  S: Patient feels better and states she has urinated multiple times since foley was removed earlier today   O:  Vitals:   02/09/17 1241 02/09/17 1826  BP: 124/67 136/80  Pulse: (!) 109 (!) 126  Resp: 18 18  Temp: 97.9 F (36.6 C) 98.3 F (36.8 C)  SpO2:     GU: labial swelling- decreased, ice pack currently in place. Not tender.   A/P: G1P1 5648w4d s/p vacuum assisted vaginal delivery doing better with decreased swelling and no difficulty voiding since foley   Plan for discharge in the morning   Sharyon CableRogers, Ivelisse Culverhouse C, CNM 02/09/2017, 11:47 PM

## 2017-02-09 NOTE — Lactation Note (Signed)
This note was copied from a baby's chart. Lactation Consultation Note Baby 7 hrs old. Sleepy. Mom has #20 NS, fitted w/#16 NS. Mom given shells, strongly encouraged to wear. Mom has semi flat nipples, a little compressible, LC doesn't feel at this time baby wouldn't be able to obtain a deep latch. Mom has generalized edema all over. Mom has edema to eye lids, face red.  RN brought DEBP kit into rm. RN to set up. LC gave mom hand pump to pre-pump prior to applying NS.  Baby sleepy at this time. Reviewed newborn behavior, STS, I&O, cluster feeding. Mom encouraged to feed baby 8-12 times/24 hours and with feeding cues.  Encouraged to call for assistance or questions. [place] brochure given w/resources, support groups and [place] services. Patient Name: Girl Renetta Suman [id number] Date: 02/09/2017 Reason for consult: Initial assessment   Maternal Data Has patient been taught Hand Expression?: Yes Does the patient have breastfeeding experience prior to this delivery?: No  Feeding Feeding Type: Breast Fed Length of feed: 15 min  LATCH Score Latch: Too sleepy or reluctant, no latch achieved, no sucking elicited.  Audible Swallowing: None  Type of Nipple: Flat(semi flat)  Comfort (Breast/Nipple): Soft / non-tender  Hold (Positioning): Full assist, staff holds infant at breast  LATCH Score: 6  Interventions Interventions: Breast feeding basics reviewed;Breast compression;Hand pump;Support pillows;DEBP;Position options;Breast massage;Hand express;Pre-pump if needed;Shells  Lactation Tools Discussed/Used Tools: Shells;Pump;Nipple Shields Nipple shield size: 16 Shell Type: Inverted Breast pump type: Manual;Double-Electric Breast Pump   Consult Status Consult Status: Follow-up Date: 02/09/17 Follow-up type: In-patient    Theodoro Kalata 02/09/2017, 6:51 AM

## 2017-02-10 MED ORDER — IBUPROFEN 800 MG PO TABS: 800.0000 mg | ORAL_TABLET | Freq: Three times a day (TID) | ORAL | 0 refills | Status: AC | PRN

## 2017-02-10 MED ORDER — FUROSEMIDE 40 MG PO TABS
40.0000 mg | ORAL_TABLET | Freq: Once | ORAL | Status: AC
  Administered 2017-02-10: 40 mg via ORAL
  Filled 2017-02-10: qty 1

## 2017-02-10 NOTE — Progress Notes (Signed)
Post Partum Day #2, VAVD Subjective: up ad lib, voiding and reports left labial swelling that has continued.  Able to void after foley catheter removed.    Objective: Blood pressure 119/88, pulse 96, temperature 97.6 F (36.4 C), resp. rate 16, height 5\' 3"  (1.6 m), weight 186 lb (84.4 kg), last menstrual period 04/30/2016, SpO2 99 %, unknown if currently breastfeeding.  Physical Exam:  General: alert, cooperative and no distress Lochia: appropriate Uterine Fundus: firm Incision: left labial hematoma not extending into the vaginal vault DVT Evaluation: No evidence of DVT seen on physical exam. No cords or calf tenderness.  Recent Labs    02/08/17 0600  HGB 11.0*  HCT 34.0*    Assessment/Plan: Discharge home, Breastfeeding and Contraception Nexplanon planned  Sitz bath and iced glove.  Late discharge home today if stable with labial hematoma.     LOS: 2 days   Roe Coombsachelle A Denney, CNM 02/10/2017, 7:05 AM

## 2017-02-10 NOTE — Discharge Summary (Signed)
OB Discharge Summary     Patient Name: Felicia Lang DOB: 03/19/1990 MRN: [id number]  Date of admission: 02/08/2017 Delivering MD: Jacklyn ShellRESENZO-DISHMON, FRANCES   Date of discharge: 02/10/2017  Admitting diagnosis: 41 WEEKS CTX BLEEDING Intrauterine pregnancy: 2761w4d     Secondary diagnosis:  Principal Problem:   Normal labor  Additional problems: left labial hematoma     Discharge diagnosis: Term Pregnancy Delivered and VAVD                                                                                                Post partum procedures:none  Augmentation: AROM and Pitocin  Complications: None  Hospital course:  Onset of Labor With Vaginal Delivery     26 y.o. yo G1P1001 at 3461w4d was admitted in Active Labor with elevated blood pressures on admission on 02/08/2017. Patient had an uncomplicated labor course as follows:  Membrane Rupture Time/Date: 9:29 AM ,02/08/2017   Intrapartum Procedures: Episiotomy: None [1]                                         Lacerations:  2nd degree [3];Vaginal [6]  Patient had a delivery of a Viable infant. 02/08/2017  Information for the patient's newborn:  Andree ElkBrown, Girl Erienne [[id number]][[id number]]  Delivery Method: Vaginal, Vacuum (Extractor)(Filed from Delivery Summary)    Pateint had an uncomplicated postpartum course.  She is ambulating, tolerating a regular diet, passing flatus, and urinating well. Patient is discharged home in stable condition on 02/10/17.   Physical exam  Vitals:   02/09/17 0555 02/09/17 1241 02/09/17 1826 02/10/17 0500  BP: (!) 115/53 124/67 136/80 119/88  Pulse: (!) 105 (!) 109 (!) 126 96  Resp: 18 18 18 16   Temp: 98.2 F (36.8 C) 97.9 F (36.6 C) 98.3 F (36.8 C) 97.6 F (36.4 C)  TempSrc: Oral Oral Oral   SpO2:      Weight:      Height:       General: alert, cooperative and no distress Lochia: appropriate Uterine Fundus: firm Incision: N/A, left labial hematoma DVT Evaluation: No evidence of DVT seen on  physical exam. No cords or calf tenderness. Labs: Lab Results  Component Value Date   WBC 12.6 (H) 02/08/2017   HGB 11.0 (L) 02/08/2017   HCT 34.0 (L) 02/08/2017   MCV 83.1 02/08/2017   PLT 210 02/08/2017   CMP Latest Ref Rng & Units 01/25/2017  Glucose 65 - 99 mg/dL 82  BUN 6 - 20 mg/dL 4(L)  Creatinine 6.290.57 - 1.00 mg/dL 5.28(U0.55(L)  Sodium 132134 - 440144 mmol/L 139  Potassium 3.5 - 5.2 mmol/L 4.2  Chloride 96 - 106 mmol/L 101  CO2 20 - 29 mmol/L 20  Calcium 8.7 - 10.2 mg/dL 9.8  Total Protein 6.0 - 8.5 g/dL 6.5  Total Bilirubin 0.0 - 1.2 mg/dL 0.2  Alkaline Phos 39 - 117 IU/L 130(H)  AST 0 - 40 IU/L 21  ALT 0 - 32 IU/L 13    Discharge instruction: per After  Visit Summary and "Baby and Me Booklet".  After visit meds:  Allergies as of 02/10/2017   No Known Allergies     Medication List    TAKE these medications   ibuprofen 800 MG tablet Commonly known as:  ADVIL,MOTRIN Take 1 tablet (800 mg total) by mouth every 8 (eight) hours as needed.   multivitamin-prenatal 27-0.8 MG Tabs tablet Take 1 tablet by mouth daily at 12 noon.       Diet: routine diet  Activity: Advance as tolerated. Pelvic rest for 6 weeks.   Outpatient follow up:2 weeks for hematoma/F/U foley cath Follow up Appt:No future appointments. Follow up Visit:No Follow-up on file.  Postpartum contraception: Nexplanon planned  Newborn Data: Live born female  Birth Weight: 8 lb 7.6 oz (3844 g) APGAR: 5, 7  Newborn Delivery   Birth date/time:  02/08/2017 23:26:00 Delivery type:  Vaginal, Vacuum (Extractor)     Baby Feeding: Breast Disposition:home with mother   02/10/2017 Roe Coombsachelle A Orlinda Slomski, CNM

## 2017-02-10 NOTE — Discharge Instructions (Signed)
Breastfeeding °Deciding to breastfeed is one of the best choices you can make for you and your baby. A change in hormones during pregnancy causes your breast tissue to grow and increases the number and size of your milk ducts. These hormones also allow proteins, sugars, and fats from your blood supply to make breast milk in your milk-producing glands. Hormones prevent breast milk from being released before your baby is born as well as prompt milk flow after birth. Once breastfeeding has begun, thoughts of your baby, as well as his or her sucking or crying, can stimulate the release of milk from your milk-producing glands. °Benefits of breastfeeding °For Your Baby °· Your first milk (colostrum) helps your baby's digestive system function better. °· There are antibodies in your milk that help your baby fight off infections. °· Your baby has a lower incidence of asthma, allergies, and sudden infant death syndrome. °· The nutrients in breast milk are better for your baby than infant formulas and are designed uniquely for your baby’s needs. °· Breast milk improves your baby's brain development. °· Your baby is less likely to develop other conditions, such as childhood obesity, asthma, or type 2 diabetes mellitus. ° °For You °· Breastfeeding helps to create a very special bond between you and your baby. °· Breastfeeding is convenient. Breast milk is always available at the correct temperature and costs nothing. °· Breastfeeding helps to burn calories and helps you lose the weight gained during pregnancy. °· Breastfeeding makes your uterus contract to its prepregnancy size faster and slows bleeding (lochia) after you give birth. °· Breastfeeding helps to lower your risk of developing type 2 diabetes mellitus, osteoporosis, and breast or ovarian cancer later in life. ° °Signs that your baby is hungry °Early Signs of Hunger °· Increased alertness or activity. °· Stretching. °· Movement of the head from side to  side. °· Movement of the head and opening of the mouth when the corner of the mouth or cheek is stroked (rooting). °· Increased sucking sounds, smacking lips, cooing, sighing, or squeaking. °· Hand-to-mouth movements. °· Increased sucking of fingers or hands. ° °Late Signs of Hunger °· Fussing. °· Intermittent crying. ° °Extreme Signs of Hunger °Signs of extreme hunger will require calming and consoling before your baby will be able to breastfeed successfully. Do not wait for the following signs of extreme hunger to occur before you initiate breastfeeding: °· Restlessness. °· A loud, strong cry. °· Screaming. ° °Breastfeeding basics °Breastfeeding Initiation °· Find a comfortable place to sit or lie down, with your neck and back well supported. °· Place a pillow or rolled up blanket under your baby to bring him or her to the level of your breast (if you are seated). Nursing pillows are specially designed to help support your arms and your baby while you breastfeed. °· Make sure that your baby's abdomen is facing your abdomen. °· Gently massage your breast. With your fingertips, massage from your chest wall toward your nipple in a circular motion. This encourages milk flow. You may need to continue this action during the feeding if your milk flows slowly. °· Support your breast with 4 fingers underneath and your thumb above your nipple. Make sure your fingers are well away from your nipple and your baby’s mouth. °· Stroke your baby's lips gently with your finger or nipple. °· When your baby's mouth is open wide enough, quickly bring your baby to your breast, placing your entire nipple and as much of the colored area   around your nipple (areola) as possible into your baby's mouth. ? More areola should be visible above your baby's upper lip than below the lower lip. ? Your baby's tongue should be between his or her lower gum and your breast.  Ensure that your baby's mouth is correctly positioned around your nipple  (latched). Your baby's lips should create a seal on your breast and be turned out (everted).  It is common for your baby to suck about 2-3 minutes in order to start the flow of breast milk.  Latching Teaching your baby how to latch on to your breast properly is very important. An improper latch can cause nipple pain and decreased milk supply for you and poor weight gain in your baby. Also, if your baby is not latched onto your nipple properly, he or she may swallow some air during feeding. This can make your baby fussy. Burping your baby when you switch breasts during the feeding can help to get rid of the air. However, teaching your baby to latch on properly is still the best way to prevent fussiness from swallowing air while breastfeeding. Signs that your baby has successfully latched on to your nipple:  Silent tugging or silent sucking, without causing you pain.  Swallowing heard between every 3-4 sucks.  Muscle movement above and in front of his or her ears while sucking.  Signs that your baby has not successfully latched on to nipple:  Sucking sounds or smacking sounds from your baby while breastfeeding.  Nipple pain.  If you think your baby has not latched on correctly, slip your finger into the corner of your babys mouth to break the suction and place it between your baby's gums. Attempt breastfeeding initiation again. Signs of Successful Breastfeeding Signs from your baby:  A gradual decrease in the number of sucks or complete cessation of sucking.  Falling asleep.  Relaxation of his or her body.  Retention of a small amount of milk in his or her mouth.  Letting go of your breast by himself or herself.  Signs from you:  Breasts that have increased in firmness, weight, and size 1-3 hours after feeding.  Breasts that are softer immediately after breastfeeding.  Increased milk volume, as well as a change in milk consistency and color by the fifth day of  breastfeeding.  Nipples that are not sore, cracked, or bleeding.  Signs That Your [employer] is Getting Enough Milk  Wetting at least 1-2 diapers during the first 24 hours after birth.  Wetting at least 5-6 diapers every 24 hours for the first week after birth. The urine should be clear or pale yellow by 5 days after birth.  Wetting 6-8 diapers every 24 hours as your baby continues to grow and develop.  At least 3 stools in a 24-hour period by age 925 days. The stool should be soft and yellow.  At least 3 stools in a 24-hour period by age 92 days. The stool should be seedy and yellow.  No loss of weight greater than 10% of birth weight during the first 19 days of age.  Average weight gain of 4-7 ounces (113-198 g) per week after age 67 days.  Consistent daily weight gain by age 677 days, without weight loss after the age of 2 weeks.  After a feeding, your baby may spit up a small amount. This is common. Breastfeeding frequency and duration Frequent feeding will help you make more milk and can prevent sore nipples and breast engorgement. Breastfeed when  you feel the need to reduce the fullness of your breasts or when your baby shows signs of hunger. This is called "breastfeeding on demand." Avoid introducing a pacifier to your baby while you are working to establish breastfeeding (the first 4-6 weeks after your baby is born). After this time you may choose to use a pacifier. Research has shown that pacifier use during the first year of a baby's life decreases the risk of sudden infant death syndrome (SIDS). °Allow your baby to feed on each breast as long as he or she wants. Breastfeed until your baby is finished feeding. When your baby unlatches or falls asleep while feeding from the first breast, offer the second breast. Because newborns are often sleepy in the first few weeks of life, you may need to awaken your baby to get him or her to feed. °Breastfeeding times will vary from baby to baby. However,  the following rules can serve as a guide to help you ensure that your baby is properly fed: °· Newborns (babies 4 weeks of age or younger) may breastfeed every 1-3 hours. °· Newborns should not go longer than 3 hours during the day or 5 hours during the night without breastfeeding. °· You should breastfeed your baby a minimum of 8 times in a 24-hour period until you begin to introduce solid foods to your baby at around 6 months of age. ° °Breast milk pumping °Pumping and storing breast milk allows you to ensure that your baby is exclusively fed your breast milk, even at times when you are unable to breastfeed. This is especially important if you are going back to work while you are still breastfeeding or when you are not able to be present during feedings. Your lactation consultant can give you guidelines on how long it is safe to store breast milk. °A breast pump is a machine that allows you to pump milk from your breast into a sterile bottle. The pumped breast milk can then be stored in a refrigerator or freezer. Some breast pumps are operated by hand, while others use electricity. Ask your lactation consultant which type will work best for you. Breast pumps can be purchased, but some hospitals and breastfeeding support groups lease breast pumps on a monthly basis. A lactation consultant can teach you how to hand express breast milk, if you prefer not to use a pump. °Caring for your breasts while you breastfeed °Nipples can become dry, cracked, and sore while breastfeeding. The following recommendations can help keep your breasts moisturized and healthy: °· Avoid using soap on your nipples. °· Wear a supportive bra. Although not required, special nursing bras and tank tops are designed to allow access to your breasts for breastfeeding without taking off your entire bra or top. Avoid wearing underwire-style bras or extremely tight bras. °· Air dry your nipples for 3-4 minutes after each feeding. °· Use only cotton  bra pads to absorb leaked breast milk. Leaking of breast milk between feedings is normal. °· Use lanolin on your nipples after breastfeeding. Lanolin helps to maintain your skin's normal moisture barrier. If you use pure lanolin, you do not need to wash it off before feeding your baby again. Pure lanolin is not toxic to your baby. You may also hand express a few drops of breast milk and gently massage that milk into your nipples and allow the milk to air dry. ° °In the first few weeks after giving birth, some women experience extremely full breasts (engorgement). Engorgement can make your   breasts feel heavy, warm, and tender to the touch. Engorgement peaks within 3-5 days after you give birth. The following recommendations can help ease engorgement:  Completely empty your breasts while breastfeeding or pumping. You may want to start by applying warm, moist heat (in the shower or with warm water-soaked hand towels) just before feeding or pumping. This increases circulation and helps the milk flow. If your baby does not completely empty your breasts while breastfeeding, pump any extra milk after he or she is finished.  Wear a snug bra (nursing or regular) or tank top for 1-2 days to signal your body to slightly decrease milk production.  Apply ice packs to your breasts, unless this is too uncomfortable for you.  Make sure that your baby is latched on and positioned properly while breastfeeding.  If engorgement persists after 48 hours of following these recommendations, contact your health care provider or a Advertising [employer]writer. Overall health care recommendations while breastfeeding  Eat healthy foods. Alternate between meals and snacks, eating 3 of each per day. Because what you eat affects your breast milk, some of the foods may make your baby more irritable than usual. Avoid eating these foods if you are sure that they are negatively affecting your baby.  Drink milk, fruit juice, and water to  satisfy your thirst (about 10 glasses a day).  Rest often, relax, and continue to take your prenatal vitamins to prevent fatigue, stress, and anemia.  Continue breast self-awareness checks.  Avoid chewing and smoking tobacco. Chemicals from cigarettes that pass into breast milk and exposure to secondhand smoke may harm your baby.  Avoid alcohol and drug use, including marijuana. Some medicines that may be harmful to your baby can pass through breast milk. It is important to ask your health care provider before taking any medicine, including all over-the-counter and prescription medicine as well as vitamin and herbal supplements. It is possible to become pregnant while breastfeeding. If birth control is desired, ask your health care provider about options that will be safe for your baby. Contact a health care provider if:  You feel like you want to stop breastfeeding or have become frustrated with breastfeeding.  You have painful breasts or nipples.  Your nipples are cracked or bleeding.  Your breasts are red, tender, or warm.  You have a swollen area on either breast.  You have a fever or chills.  You have nausea or vomiting.  You have drainage other than breast milk from your nipples.  Your breasts do not become full before feedings by the fifth day after you give birth.  You feel sad and depressed.  Your baby is too sleepy to eat well.  Your baby is having trouble sleeping.  Your baby is wetting less than 3 diapers in a 24-hour period.  Your baby has less than 3 stools in a 24-hour period.  Your baby's skin or the white part of his or her eyes becomes yellow.  Your baby is not gaining weight by 51 days of age. Get help right away if:  Your baby is overly tired (lethargic) and does not want to wake up and feed.  Your baby develops an unexplained fever. This information is not intended to replace advice given to you by your health care provider. Make sure you discuss  any questions you have with your health care provider. Document Released: 02/27/2005 Document Revised: 08/11/2015 Document Reviewed: 08/21/2012 Elsevier Interactive Patient Education  2017 [employer]   Breast Pumping Tips If you  are breastfeeding, there may be times when you cannot feed your baby directly. Returning to work or going on a trip are common examples. Pumping allows you to store breast milk and feed it to your baby later. You may not get much milk when you first start to pump. Your breasts should start to make more after a few days. If you pump at the times you usually feed your baby, you may be able to keep making enough milk to feed your baby without also using formula. The more often you pump, the more milk you will produce. When should I pump?  You can begin to pump soon after delivery. However, some experts recommend waiting about 4 weeks before giving your infant a bottle to make sure breastfeeding is going well.  If you plan to return to work, begin pumping a few weeks before. This will help you develop techniques that work best for you. It also lets you build up a supply of breast milk.  When you are with your infant, feed on demand and pump after each feeding.  When you are away from your infant for several hours, pump for about 15 minutes every 2-3 hours. Pump both breasts at the same time if you can.  If your infant has a formula feeding, make sure to pump around the same time.  If you drink any alcohol, wait 2 hours before pumping. How do I prepare to pump? Your let-down reflexis the natural reaction to stimulation that makes your breast milk flow. It is easier to stimulate this reflex when you are relaxed. Find relaxation techniques that work for you. If you have difficulty with your let-down reflex, try these methods:  Smell one of your infant's blankets or an item of clothing.  Look at a picture or video of your infant.  Sit in a quiet, private  space.  Massage the breast you plan to pump.  Place soothing warmth on the breast.  Play relaxing music.  What are some general breast pumping tips?  Wash your hands before you pump. You do not need to wash your nipples or breasts.  There are three ways to pump. ? You can use your hand to massage and compress your breast. ? You can use a handheld manual pump. ? You can use an electric pump.  Make sure the suction cup (flange) on the breast pump is the right size. Place the flange directly over the nipple. If it is the wrong size or placed the wrong way, it may be painful and cause nipple damage.  If pumping is uncomfortable, apply a small amount of purified or modified lanolin to your nipple and areola.  If you are using an electric pump, adjust the speed and suction power to be more comfortable.  If pumping is painful or if you are not getting very much milk, you may need a different type of pump. A lactation consultant can help you determine what type of pump to use.  Keep a full water bottle near you at all times. Drinking lots of fluid helps you make more milk.  You can store your milk to use later. Pumped breast milk can be stored in a sealable, sterile container or plastic bag. Label all stored breast milk with the date you pumped it. ? Milk can stay out at room temperature for up to 8 hours. ? You can store your milk in the refrigerator for up to 8 days. ? You can store your milk in the  freezer for 3 months. Thaw frozen milk using warm water. Do not put it in the microwave.  Do not smoke. Smoking can lower your milk supply and harm your infant. If you need help quitting, ask your health care provider to recommend a program. When should I call my health care provider or a lactation consultant?  You are having trouble pumping.  You are concerned that you are not making enough milk.  You have nipple pain, soreness, or redness.  You want to use birth control. Birth control  pills may lower your milk supply. Talk to your health care provider about your options. This information is not intended to replace advice given to you by your health care provider. Make sure you discuss any questions you have with your health care provider. Document Released: 08/17/2009 Document Revised: 08/11/2015 Document Reviewed: 12/20/2012 Elsevier Interactive Patient Education  2017 [employer]   Home Care Instructions for Mom ACTIVITY  Gradually return to your regular activities.  Let yourself rest. Nap while your baby sleeps.  Avoid lifting anything that is heavier than 10 lb (4.5 kg) until your health care provider says it is okay.  Avoid activities that take a lot of effort and energy (are strenuous) until approved by your health care provider. Walking at a slow-to-moderate pace is usually safe.  If you had a cesarean delivery: ? Do not vacuum, climb stairs, or drive a car for 4-6 weeks. ? Have someone help you at home until you feel like you can do your usual activities yourself. ? Do exercises as told by your health care provider, if this applies.  VAGINAL BLEEDING You may continue to bleed for 4-6 weeks after delivery. Over time, the amount of blood usually decreases and the color of the blood usually gets lighter. However, the flow of bright red blood may increase if you have been too active. If you need to use more than one pad in an hour because your pad gets soaked, or if you pass a large clot:  Lie down.  Raise your feet.  Place a cold compress on your lower abdomen.  Rest.  Call your health care provider.  If you are breastfeeding, your period should return anytime between 8 weeks after delivery and the time that you stop breastfeeding. If you are not breastfeeding, your period should return 6-8 weeks after delivery. PERINEAL CARE The perineal area, or perineum, is the part of your body between your thighs. After delivery, this area needs special care.  Follow these instructions as told by your health care provider.  Take warm tub baths for 15-20 minutes.  Use medicated pads and pain-relieving sprays and creams as told.  Do not use tampons or douches until vaginal bleeding has stopped.  Each time you go to the bathroom: ? Use a peri bottle. ? Change your pad. ? Use towelettes in place of toilet paper until your stitches have healed.  Do Kegel exercises every day. Kegel exercises help to maintain the muscles that support the vagina, bladder, and bowels. You can do these exercises while you are standing, sitting, or lying down. To do Kegel exercises: ? Tighten the muscles of your abdomen and the muscles that surround your birth canal. ? Hold for a few seconds. ? Relax. ? Repeat until you have done this 5 times in a row.  To prevent hemorrhoids from developing or getting worse: ? Drink enough fluid to keep your urine clear or pale yellow. ? Avoid straining when having a bowel  movement. ? Take over-the-counter medicines and stool softeners as told by your health care provider.  BREAST CARE  Wear a tight-fitting bra.  Avoid taking over-the-counter pain medicine for breast discomfort.  Apply ice to the breasts to help with discomfort as needed: ? Put ice in a plastic bag. ? Place a towel between your skin and the bag. ? Leave the ice on for 20 minutes or as told by your health care provider.  NUTRITION  Eat a well-balanced diet.  Do not try to lose weight quickly by cutting back on calories.  Take your prenatal vitamins until your postpartum checkup or until your health care provider tells you to stop.  POSTPARTUM DEPRESSION You may find yourself crying for no apparent reason and unable to cope with all of the changes that come with having a newborn. This mood is called postpartum depression. Postpartum depression happens because your hormone levels change after delivery. If you have postpartum depression, get support from your  partner, friends, and family. If the depression does not go away on its own after several weeks, contact your health care provider. BREAST SELF-EXAM Do a breast self-exam each month, at the same time of the month. If you are breastfeeding, check your breasts just after a feeding, when your breasts are less full. If you are breastfeeding and your period has started, check your breasts on day 5, 6, or 7 of your period. Report any lumps, bumps, or discharge to your health care provider. Know that breasts are normally lumpy if you are breastfeeding. This is temporary, and it is not a health risk. INTIMACY AND SEXUALITY Avoid sexual activity for at least 3-4 weeks after delivery or until the brownish-red vaginal flow is completely gone. If you want to avoid pregnancy, use some form of birth control. You can get pregnant after delivery, even if you have not had your period. SEEK MEDICAL CARE IF:  You feel unable to cope with the changes that a child brings to your life, and these feelings do not go away after several weeks.  You notice a lump, a bump, or discharge on your breast.  SEEK IMMEDIATE MEDICAL CARE IF:  Blood soaks your pad in 1 hour or less.  You have: ? Severe pain or cramping in your lower abdomen. ? A bad-smelling vaginal discharge. ? A fever that is not controlled by medicine. ? A fever, and an area of your breast is red and sore. ? Pain or redness in your calf. ? Sudden, severe chest pain. ? Shortness of breath. ? Painful or bloody urination. ? Problems with your vision.  You vomit for 12 hours or longer.  You develop a severe headache.  You have serious thoughts about hurting yourself, your child, or anyone else.  This information is not intended to replace advice given to you by your health care provider. Make sure you discuss any questions you have with your health care provider. Document Released: 02/25/2000 Document Revised: 08/05/2015 Document Reviewed:  08/31/2014 Elsevier Interactive Patient Education  2017 [employer]   Vaginal Delivery, Care After Refer to this sheet in the next few weeks. These instructions provide you with information about caring for yourself after vaginal delivery. Your health care provider may also give you more specific instructions. Your treatment has been planned according to current medical practices, but problems sometimes occur. Call your health care provider if you have any problems or questions. What can I expect after the procedure? After vaginal delivery, it is common to  have:  Some bleeding from your vagina.  Soreness in your abdomen, your vagina, and the area of skin between your vaginal opening and your anus (perineum).  Pelvic cramps.  Fatigue.  Follow these instructions at home: Medicines  Take over-the-counter and prescription medicines only as told by your health care provider.  If you were prescribed an antibiotic medicine, take it as told by your health care provider. Do not stop taking the antibiotic until it is finished. Driving   Do not drive or operate heavy machinery while taking prescription pain medicine.  Do not drive for 24 hours if you received a sedative. Lifestyle  Do not drink alcohol. This is especially important if you are breastfeeding or taking medicine to relieve pain.  Do not use tobacco products, including cigarettes, chewing tobacco, or e-cigarettes. If you need help quitting, ask your health care provider. Eating and drinking  Drink at least 8 eight-ounce glasses of water every day unless you are told not to by your health care provider. If you choose to breastfeed your baby, you may need to drink more water than this.  Eat high-fiber foods every day. These foods may help prevent or relieve constipation. High-fiber foods include: ? Whole grain cereals and breads. ? Orzechowski rice. ? Beans. ? Fresh fruits and vegetables. Activity  Return to your normal  activities as told by your health care provider. Ask your health care provider what activities are safe for you.  Rest as much as possible. Try to rest or take a nap when your baby is sleeping.  Do not lift anything that is heavier than your baby or 10 lb (4.5 kg) until your health care provider says that it is safe.  Talk with your health care provider about when you can engage in sexual activity. This may depend on your: ? Risk of infection. ? Rate of healing. ? Comfort and desire to engage in sexual activity. Vaginal Care  If you have an episiotomy or a vaginal tear, check the area every day for signs of infection. Check for: ? More redness, swelling, or pain. ? More fluid or blood. ? Warmth. ? Pus or a bad smell.  Do not use tampons or douches until your health care provider says this is safe.  Watch for any blood clots that may pass from your vagina. These may look like clumps of dark red, Strey, or black discharge. General instructions  Keep your perineum clean and dry as told by your health care provider.  Wear loose, comfortable clothing.  Wipe from front to back when you use the toilet.  Ask your health care provider if you can shower or take a bath. If you had an episiotomy or a perineal tear during labor and delivery, your health care provider may tell you not to take baths for a certain length of time.  Wear a bra that supports your breasts and fits you well.  If possible, have someone help you with household activities and help care for your baby for at least a few days after you leave the hospital.  Keep all follow-up visits for you and your baby as told by your health care provider. This is important. Contact a health care provider if:  You have: ? Vaginal discharge that has a bad smell. ? Difficulty urinating. ? Pain when urinating. ? A sudden increase or decrease in the frequency of your bowel movements. ? More redness, swelling, or pain around your  episiotomy or vaginal tear. ? More fluid  or blood coming from your episiotomy or vaginal tear. ? Pus or a bad smell coming from your episiotomy or vaginal tear. ? A fever. ? A rash. ? Little or no interest in activities you used to enjoy. ? Questions about caring for yourself or your baby.  Your episiotomy or vaginal tear feels warm to the touch.  Your episiotomy or vaginal tear is separating or does not appear to be healing.  Your breasts are painful, hard, or turn red.  You feel unusually sad or worried.  You feel nauseous or you vomit.  You pass large blood clots from your vagina. If you pass a blood clot from your vagina, save it to show to your health care provider. Do not flush blood clots down the toilet without having your health care provider look at them.  You urinate more than usual.  You are dizzy or light-headed.  You have not breastfed at all and you have not had a menstrual period for 12 weeks after delivery.  You have stopped breastfeeding and you have not had a menstrual period for 12 weeks after you stopped breastfeeding. Get help right away if:  You have: ? Pain that does not go away or does not get better with medicine. ? Chest pain. ? Difficulty breathing. ? Blurred vision or spots in your vision. ? Thoughts about hurting yourself or your baby.  You develop pain in your abdomen or in one of your legs.  You develop a severe headache.  You faint.  You bleed from your vagina so much that you fill two sanitary pads in one hour. This information is not intended to replace advice given to you by your health care provider. Make sure you discuss any questions you have with your health care provider. Document Released: 02/25/2000 Document Revised: 08/11/2015 Document Reviewed: 03/14/2015 Elsevier Interactive Patient Education  2017 [employer]

## 2017-02-10 NOTE — Lactation Note (Signed)
This note was copied from a baby's chart. Lactation Consultation Note Baby 19 hrs old. Baby was slow w/out put. After 24 hrs. Baby was sleepy, BF picking up pace as well as out put. Baby has dark cephalhematoma to top of head from vacuum extract. Mom stating baby wanting to BF more and longer. Discussed cluster feeding should be going on at this time.Faythe Dingwall mom that since has bad bruising, may have jaundice, can cause drowsiness and poor feedings and poor output. Baby needs to have increased feedings and output to excrete bilirubin. Encouraged mom to use shells between feeding to obtain deeper latch. Nipples intact no bruising noted. Encouraged to pre-pump to evert the nipple for deeper latch and stimulation. Encouraged to use DEBP post BF for stimulation and supplementation to aide in output if needed. D/t possible jaundice, may need to supplement, preferable w/BM, pumping and hand expressing strongly suggested. Ask staff for assistance. Mom to ask RN to set up DEBP. Kit in rm.as well as pump.  Gave mom vial for hand expressing colostrum w/milk storage information reviewed.  Call for questions for assistance.   Patient Name: Felicia Lang TDHRC'B Date: 02/10/2017 Reason for consult: Follow-up assessment   Maternal Data    Feeding Feeding Type: Breast Fed Length of feed: 15 min  LATCH Score Latch: Grasps breast easily, tongue down, lips flanged, rhythmical sucking.  Audible Swallowing: A few with stimulation  Type of Nipple: Everted at rest and after stimulation  Comfort (Breast/Nipple): Filling, red/small blisters or bruises, mild/mod discomfort(sore)  Hold (Positioning): No assistance needed to correctly position infant at breast.  LATCH Score: 8  Interventions Interventions: Breast feeding basics reviewed;Breast compression;Hand pump;Support pillows;Breast massage;Position options;Hand express;Pre-pump if needed;Shells  Lactation Tools Discussed/Used Tools:  Pump;Shells;Nipple Shields Nipple shield size: 16 Shell Type: Inverted   Consult Status Consult Status: Follow-up Date: 02/11/17 Follow-up type: In-patient    Theodoro Kalata 02/10/2017, 6:21 AM

## 2017-02-11 ENCOUNTER — Inpatient Hospital Stay (HOSPITAL_COMMUNITY)
Admission: RE | Admit: 2017-02-11 | Discharge: 2017-02-11 | Disposition: A | Discharge status: Home / Self Care | Payer: BLUE CROSS/BLUE SHIELD | Source: Ambulatory Visit | Attending: Obstetrics & Gynecology | Admitting: Obstetrics & Gynecology

## 2017-02-13 ENCOUNTER — Other Ambulatory Visit: Payer: BLUE CROSS/BLUE SHIELD

## 2017-02-15 ENCOUNTER — Ambulatory Visit: Payer: BLUE CROSS/BLUE SHIELD | Admitting: Student

## 2017-03-08 ENCOUNTER — Ambulatory Visit (INDEPENDENT_AMBULATORY_CARE_PROVIDER_SITE_OTHER): Payer: BLUE CROSS/BLUE SHIELD | Admitting: Student

## 2017-03-08 ENCOUNTER — Encounter: Payer: Self-pay | Admitting: Student

## 2017-03-08 VITALS — BP 114/79 | HR 98 | Ht 63.0 in | Wt 160.0 lb

## 2017-03-08 DIAGNOSIS — Z3049 Encounter for surveillance of other contraceptives: Secondary | ICD-10-CM | POA: Diagnosis not present

## 2017-03-08 DIAGNOSIS — Z3202 Encounter for pregnancy test, result negative: Secondary | ICD-10-CM

## 2017-03-08 DIAGNOSIS — Z30019 Encounter for initial prescription of contraceptives, unspecified: Secondary | ICD-10-CM

## 2017-03-08 LAB — POCT PREGNANCY, URINE: Preg Test, Ur: NEGATIVE

## 2017-03-08 MED ORDER — ETONOGESTREL 68 MG ~~LOC~~ IMPL
68.0000 mg | DRUG_IMPLANT | Freq: Once | SUBCUTANEOUS | Status: AC
  Administered 2017-03-08: 68 mg via SUBCUTANEOUS

## 2017-03-08 NOTE — Patient Instructions (Addendum)
Nexplanon Instructions After Insertion   Keep bandage clean and dry for 24 hours   May use ice/Tylenol/Ibuprofen for soreness or pain   If you develop fever, drainage or increased warmth from incision site-contact office immediately  Etonogestrel implant What is this medicine? ETONOGESTREL (et oh noe JES trel) is a contraceptive (birth control) device. It is used to prevent pregnancy. It can be used for up to 3 years. This medicine may be used for other purposes; ask your health care provider or pharmacist if you have questions. COMMON BRAND NAME(S): Implanon, Nexplanon What should I tell my health care provider before I take this medicine? They need to know if you have any of these conditions: -abnormal vaginal bleeding -blood vessel disease or blood clots -cancer of the breast, cervix, or liver -depression -diabetes -gallbladder disease -headaches -heart disease or recent heart attack -high blood pressure -high cholesterol -kidney disease -liver disease -renal disease -seizures -tobacco smoker -an unusual or allergic reaction to etonogestrel, other hormones, anesthetics or antiseptics, medicines, foods, dyes, or preservatives -pregnant or trying to get pregnant -breast-feeding How should I use this medicine? This device is inserted just under the skin on the inner side of your upper arm by a health care professional. Talk to your pediatrician regarding the use of this medicine in children. Special care may be needed. Overdosage: If you think you have taken too much of this medicine contact a poison control center or emergency room at once. NOTE: This medicine is only for you. Do not share this medicine with others. What if I miss a dose? This does not apply. What may interact with this medicine? Do not take this medicine with any of the following medications: -amprenavir -bosentan -fosamprenavir This medicine may also interact with the following  medications: -barbiturate medicines for inducing sleep or treating seizures -certain medicines for fungal infections like ketoconazole and itraconazole -grapefruit juice -griseofulvin -medicines to treat seizures like carbamazepine, felbamate, oxcarbazepine, phenytoin, topiramate -modafinil -phenylbutazone -rifampin -rufinamide -some medicines to treat HIV infection like atazanavir, indinavir, lopinavir, nelfinavir, tipranavir, ritonavir -St. John's wort This list may not describe all possible interactions. Give your health care provider a list of all the medicines, herbs, non-prescription drugs, or dietary supplements you use. Also tell them if you smoke, drink alcohol, or use illegal drugs. Some items may interact with your medicine. What should I watch for while using this medicine? This product does not protect you against HIV infection (AIDS) or other sexually transmitted diseases. You should be able to feel the implant by pressing your fingertips over the skin where it was inserted. Contact your doctor if you cannot feel the implant, and use a non-hormonal birth control method (such as condoms) until your doctor confirms that the implant is in place. If you feel that the implant may have broken or become bent while in your arm, contact your healthcare provider. What side effects may I notice from receiving this medicine? Side effects that you should report to your doctor or health care professional as soon as possible: -allergic reactions like skin rash, itching or hives, swelling of the face, lips, or tongue -breast lumps -changes in emotions or moods -depressed mood -heavy or prolonged menstrual bleeding -pain, irritation, swelling, or bruising at the insertion site -scar at site of insertion -signs of infection at the insertion site such as fever, and skin redness, pain or discharge -signs of pregnancy -signs and symptoms of a blood clot such as breathing problems; changes in  vision; chest pain; severe,   sudden headache; pain, swelling, warmth in the leg; trouble speaking; sudden numbness or weakness of the face, arm or leg -signs and symptoms of liver injury like dark yellow or Piehl urine; general ill feeling or flu-like symptoms; light-colored stools; loss of appetite; nausea; right upper belly pain; unusually weak or tired; yellowing of the eyes or skin -unusual vaginal bleeding, discharge -signs and symptoms of a stroke like changes in vision; confusion; trouble speaking or understanding; severe headaches; sudden numbness or weakness of the face, arm or leg; trouble walking; dizziness; loss of balance or coordination Side effects that usually do not require medical attention (report to your doctor or health care professional if they continue or are bothersome): -acne -back pain -breast pain -changes in weight -dizziness -general ill feeling or flu-like symptoms -headache -irregular menstrual bleeding -nausea -sore throat -vaginal irritation or inflammation This list may not describe all possible side effects. Call your doctor for medical advice about side effects. You may report side effects to FDA at 1-800-FDA-1088. Where should I keep my medicine? This drug is given in a hospital or clinic and will not be stored at home. NOTE: This sheet is a summary. It may not cover all possible information. If you have questions about this medicine, talk to your doctor, pharmacist, or health care provider.  2018 Elsevier/Gold Standard (2015-09-16 11:19:22)  

## 2017-03-08 NOTE — Progress Notes (Signed)
Subjective:     Felicia Lang is a 26 y.o. female who presents for a postpartum visit. She is 4 weeks postpartum following a spontaneous vaginal delivery. I have fully reviewed the prenatal and intrapartum course. The delivery was at 41 gestational weeks. Outcome: spontaneous vaginal delivery. Anesthesia: epidural. Postpartum course has been uneventful. Her left labial hematoma has resolved and now only aches slightly when she has been standing for a long time.  Baby's course has been normal. Baby is feeding by breast. Bleeding staining only. Bowel function is normal. Bladder function is normal. Patient is not sexually active. Contraception method is Nexplanon. Postpartum depression screening: negative.  The following portions of the patient's history were reviewed and updated as appropriate: allergies, current medications, past family history, past medical history, past social history, past surgical history and problem list.  Review of Systems Pertinent items are noted in HPI.   Objective:    LMP 04/30/2016 (Exact Date)   General:  alert, cooperative and no distress   Breasts:  inspection negative, no nipple discharge or bleeding, no masses or nodularity palpable  Lungs: clear to auscultation bilaterally  Heart:  regular rate and rhythm, S1, S2 normal, no murmur, click, rub or gallop  Abdomen: normal findings: no masses palpable   Vulva:  not evaluated  Vagina: not evaluated  Cervix:  not evaluated  Corpus: not examined  Adnexa:  not evaluated  Rectal Exam: Not performed.        Assessment:    Healthy postpartum exam. Pap smear not done at today's visit.   Plan:    1. Contraception: Nexplanon (see procedure note below).  2. Encouraged her to call us as needed; reviewed Nexplanon aftercare.  3. Follow up in: 1 year or as needed. '  NEXPLANON INSERTION: Appropriate time out taken. Nexlanon site (left arm) identified and the area was prepped in usual sterile fashon. 2 cc of 1%  lidocaine was used to anesthetize the area starting with the distal end.   Next, the area was cleansed with betadine and the Nexplanon was inserted without difficulty.  Pressure bandage was applied.  Pt was instructed to remove pressure bandage in a few hours, and keep insertion site covered with a bandaid for 3 days. Patient palpated Nexplanon in her left arm.   Luna KitchensKathryn Carlis Burnsworth CNM

## 2017-03-20 ENCOUNTER — Encounter: Payer: Self-pay | Admitting: *Deleted

## 2017-11-15 IMAGING — US US MFM OB FOLLOW-UP
1 series · 14 of 28 positions shown · non-contrast
Comparison: none

[Series 1: us mfm ob follow-up · 14 of 56 slices shown]
[im 3/56]
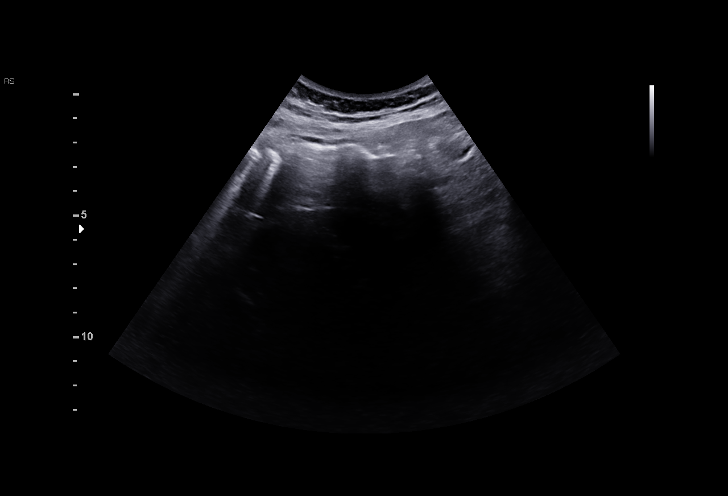
[im 7/56]
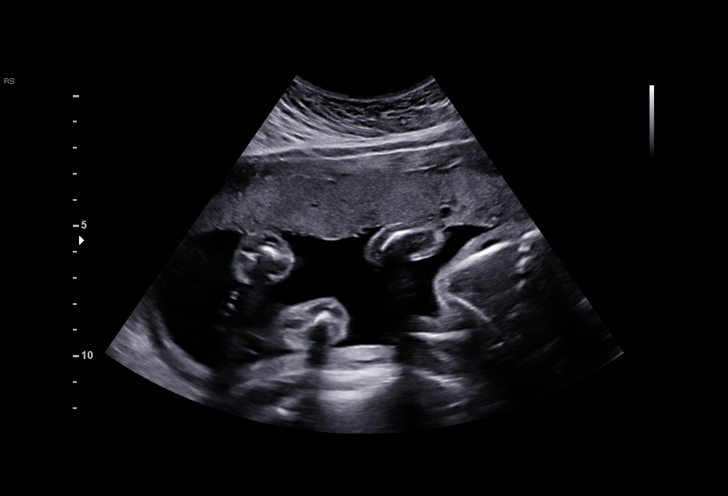
[im 11/56]
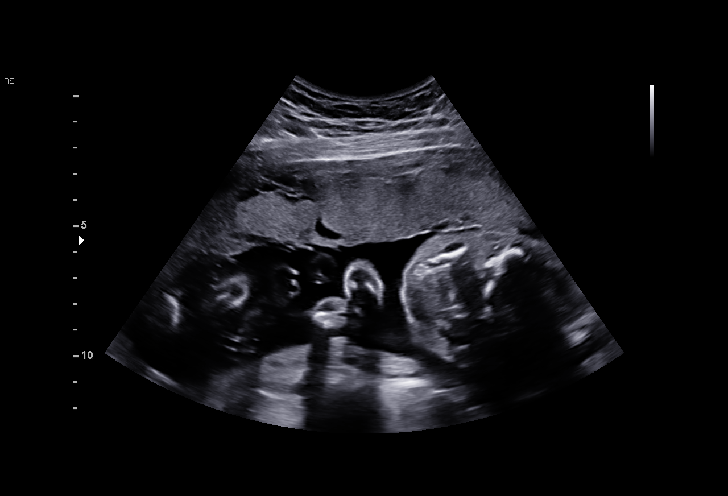
[im 15/56]
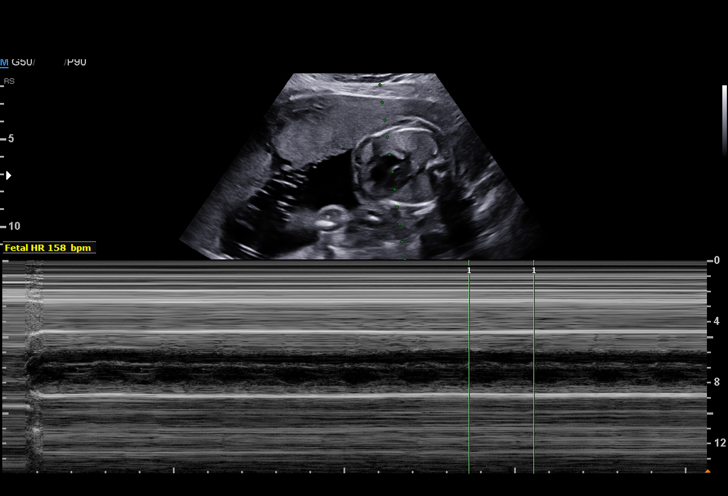
[im 19/56]
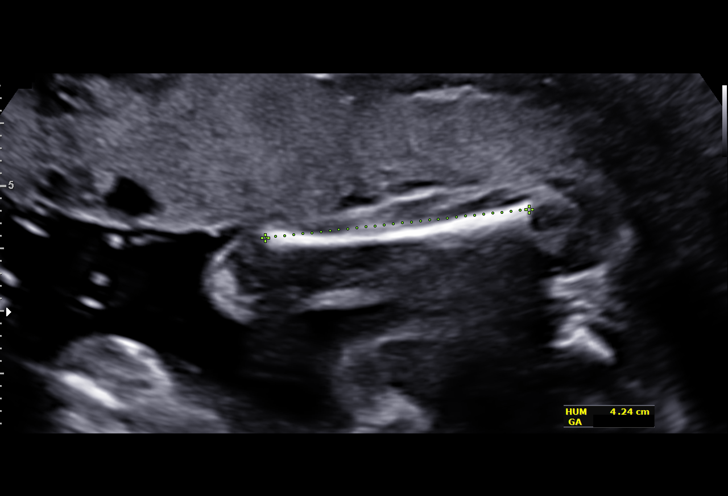
[im 23/56]
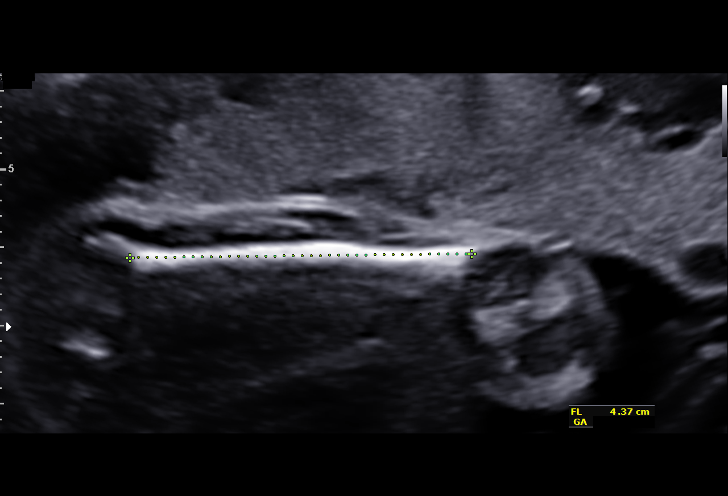
[im 27/56]
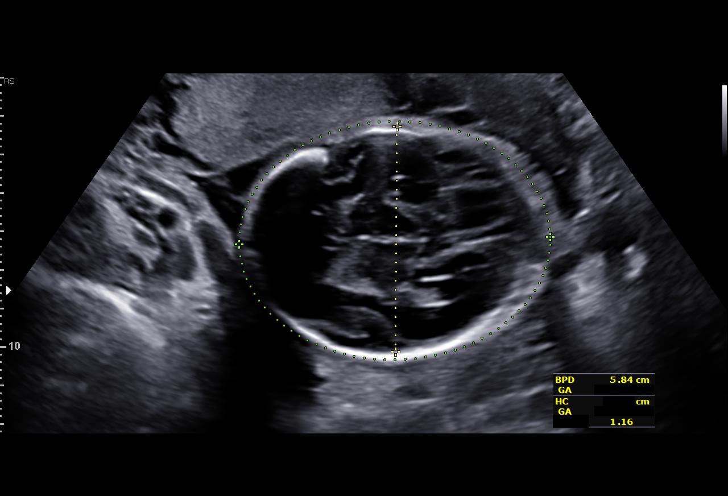
[im 31/56]
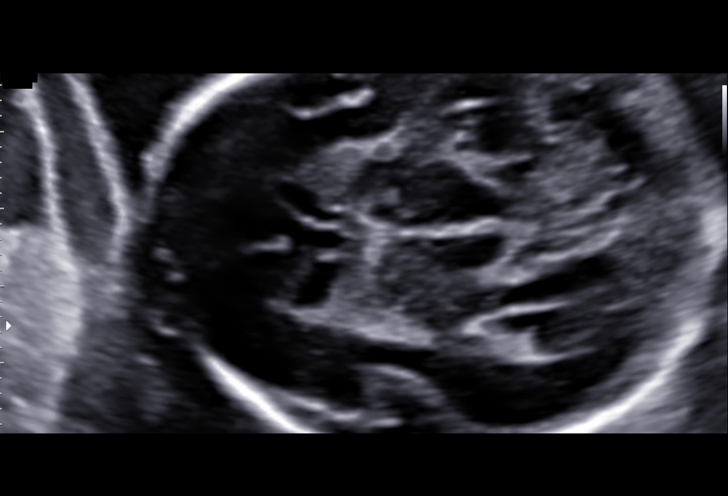
[im 35/56]
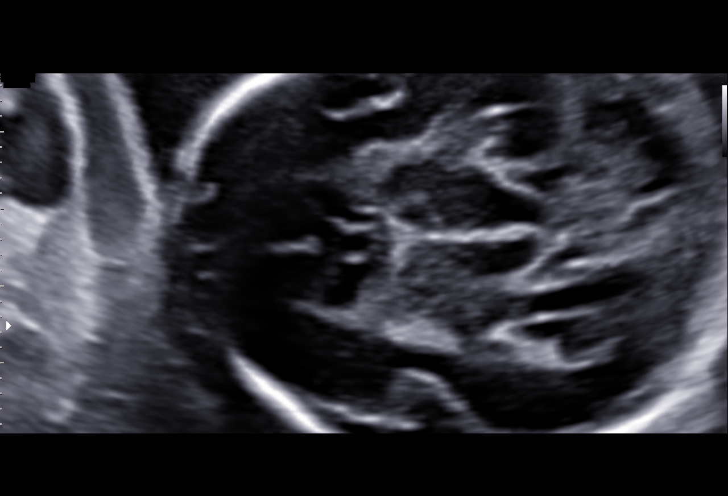
[im 39/56]
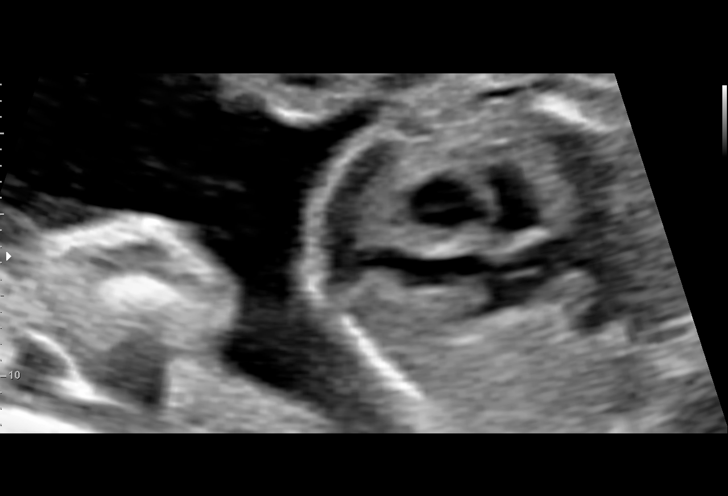
[im 43/56]
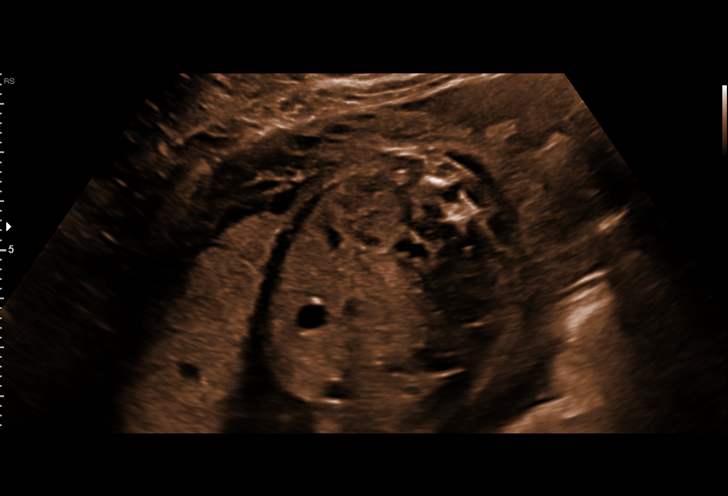
[im 47/56]
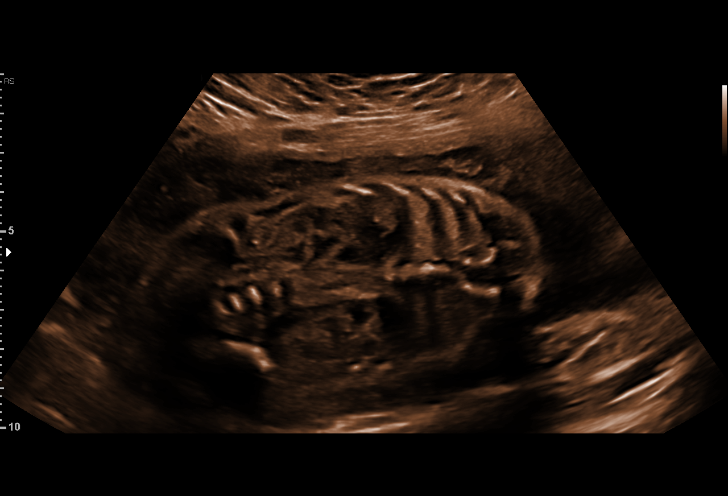
[im 51/56]
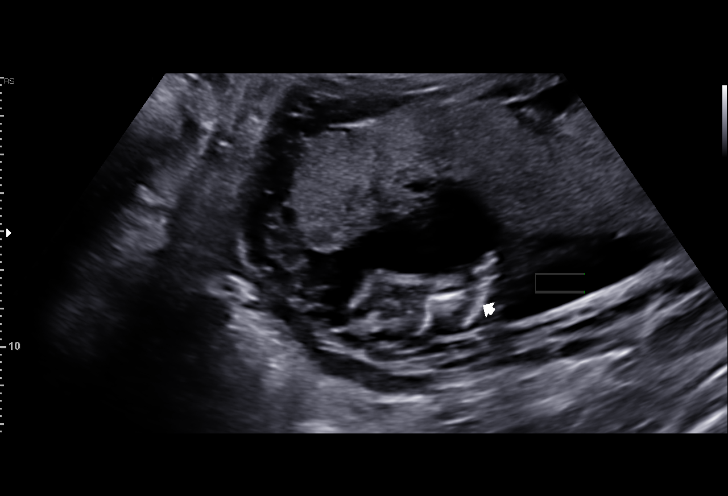
[im 56/56]
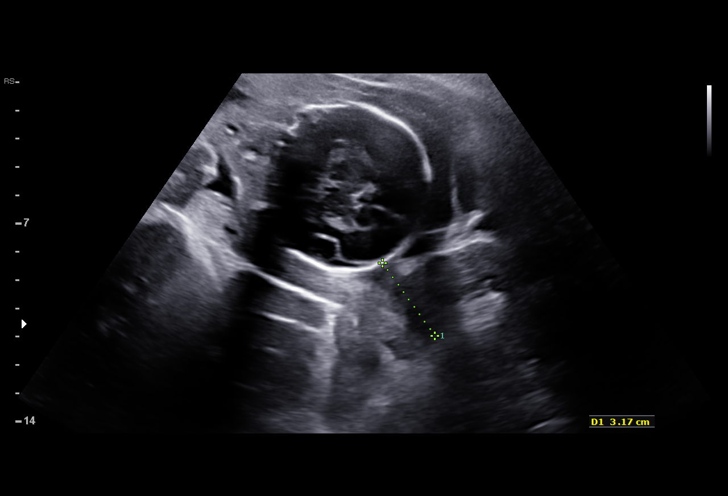

[14 of 28 positions shown; findings below may reference images not displayed]

1  MAKAI FEDELE           [id number]      [id number]     [id number]
Indications

24 weeks gestation of pregnancy
Encounter for other antenatal screening
follow-up
Evaluate anatomy not seen on prior
sonogram
OB History

Blood Type:            Height:  5'3"   Weight (lb):  148       BMI:
Gravidity:    1         Term:   0        Prem:   0         SAB:   0
TOP:          0       Ectopic:  0        Living: 0
Fetal Evaluation

Num Of Fetuses:     1
Fetal Heart         158
Rate(bpm):
Cardiac Activity:   Observed
Presentation:       Cephalic
Placenta:           Anterior, above cervical os
P. Cord Insertion:  Previously Visualized

Amniotic Fluid
AFI FV:      Subjectively within normal limits

Largest Pocket(cm)
4.84
Biometry

BPD:      57.9  mm     G. Age:  23w 5d         20  %    CI:         66.06  %    70 - 86
FL/HC:       19.0  %    18.7 -
HC:      228.5  mm     G. Age:  24w 6d         48  %    HC/AC:       1.17       1.05 -
AC:      195.6  mm     G. Age:  24w 2d         36  %    FL/BPD:      75.0  %    71 - 87
FL:       43.4  mm     G. Age:  24w 2d         31  %    FL/AC:       22.2  %    20 - 24
HUM:      40.7  mm     G. Age:  24w 5d         48  %

Est. FW:     679   gm     1 lb 8 oz     49  %
Gestational Age

LMP:           24w 3d        Date:  04/30/16                 EDD:    02/04/17
U/S Today:     24w 2d                                        EDD:    02/05/17
Best:          24w 3d     Det. By:  LMP  (04/30/16)          EDD:    02/04/17
Anatomy

Cranium:               Appears normal         Aortic Arch:            Previously seen
Cavum:                 Appears normal         Ductal Arch:            Appears normal
Ventricles:            Previously seen        Diaphragm:              Previously seen
Choroid Plexus:        Previously seen        Stomach:                Appears normal, left
sided
Cerebellum:            Previously seen        Abdomen:                Appears normal
Posterior Fossa:       Previously seen        Abdominal Wall:         Previously seen
Nuchal Fold:           Not applicable (>20    Cord Vessels:           Previously seen
wks GA)
Face:                  Orbits and profile     Kidneys:                Appear normal
previously seen
Lips:                  Previously seen        Bladder:                Appears normal
Thoracic:              Appears normal         Spine:                  Previously seen
Heart:                 Appears normal         Upper Extremities:      Previously seen
(4CH, axis, and
situs)
RVOT:                  Previously seen        Lower Extremities:      Previously seen
LVOT:                  Previously seen

Other:  Parents do not wish to know sex of fetus.  Nasal bone previously
visualized. Technically difficult due to fetal position.
Cervix Uterus Adnexa

Cervix
Length:           3.12  cm.
Normal appearance by transabdominal scan.

Uterus
No abnormality visualized.

Left Ovary
Not visualized.

Right Ovary
Within normal limits.

Adnexa:       No abnormality visualized. No adnexal mass
visualized.
Impression

SIUP at 24+3 weeks
Normal interval anatomy; anatomic survey complete
Normal amniotic fluid volume
Appropriate interval growth with EFW at the 49th %tile
Recommendations

Follow-up as clinically indicated

## 2018-03-07 IMAGING — US US FETAL BPP W/ NON-STRESS
1 series · 13 of 13 positions shown · non-contrast
Comparison: none

[Series 1: us fetal bpp w/nonstress · 13 acquisitions, 13 frames shown]
[im 1/13]
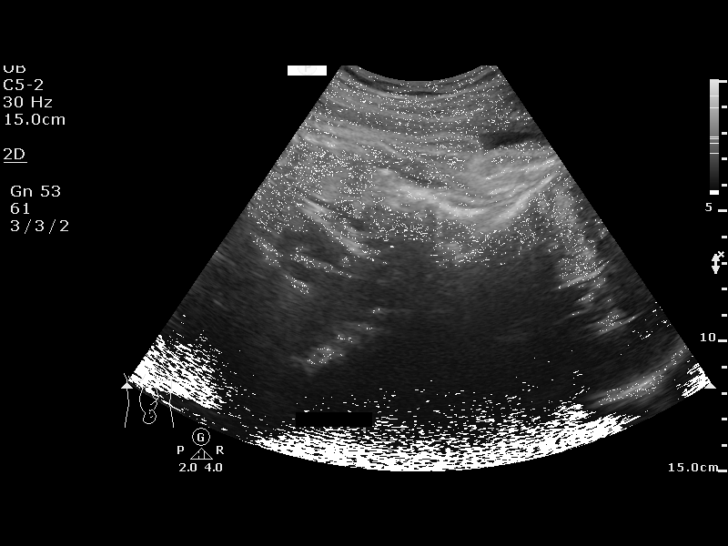
[im 2/13]
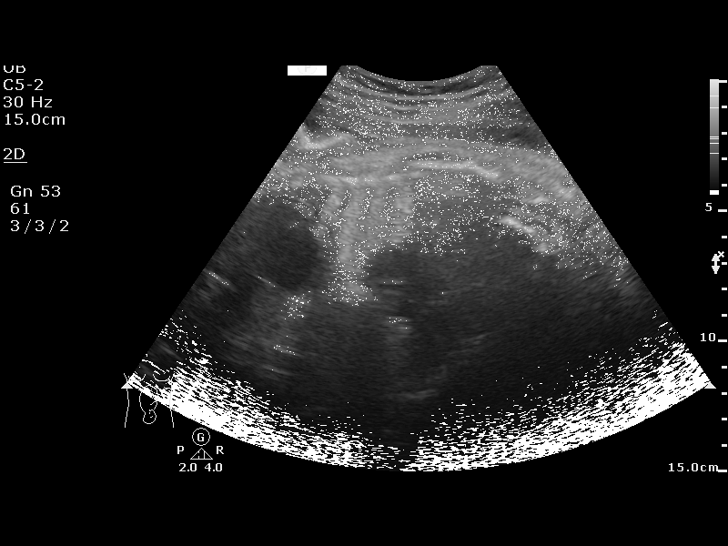
[im 3/13]
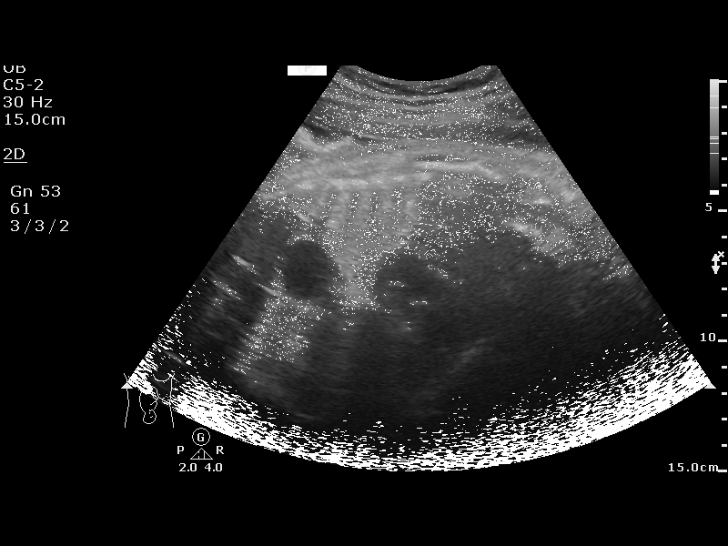
[im 4/13]
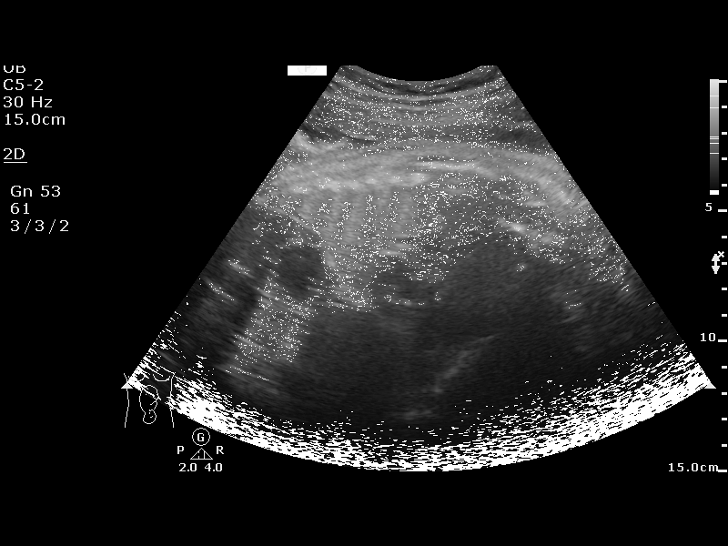
[im 5/13]
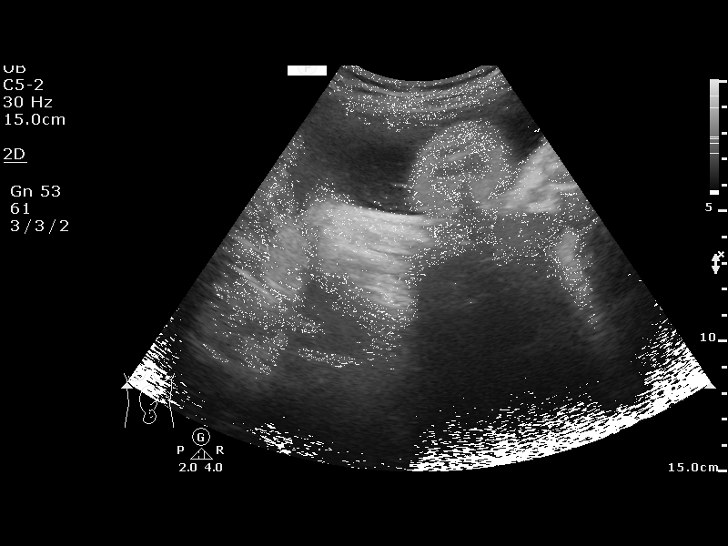
[im 6/13]
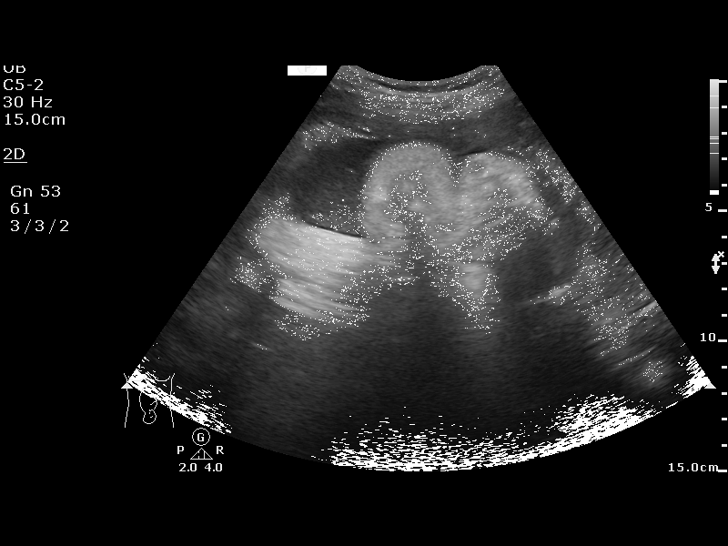
[im 7/13]
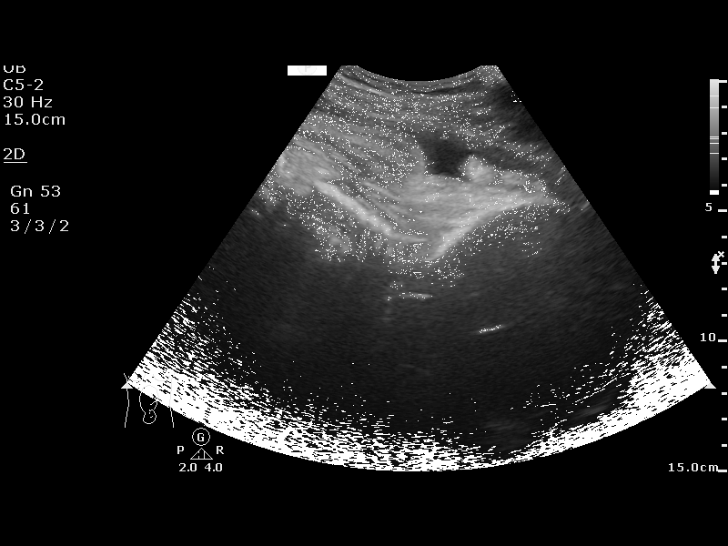
[im 8/13]
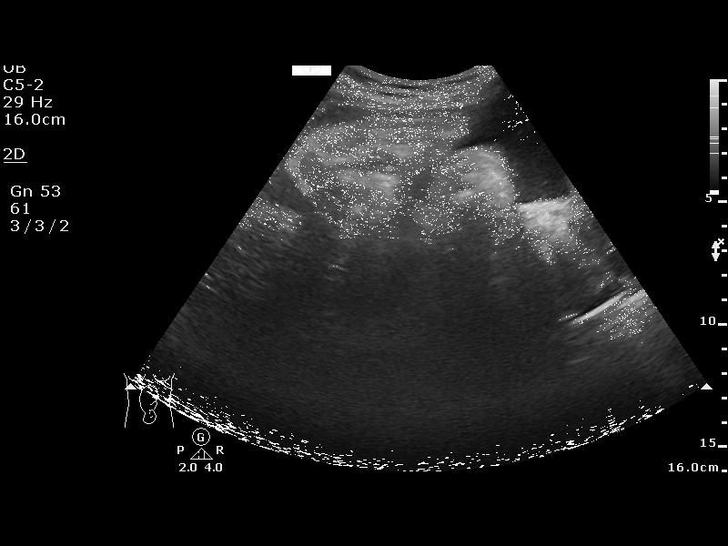
[im 9/13]
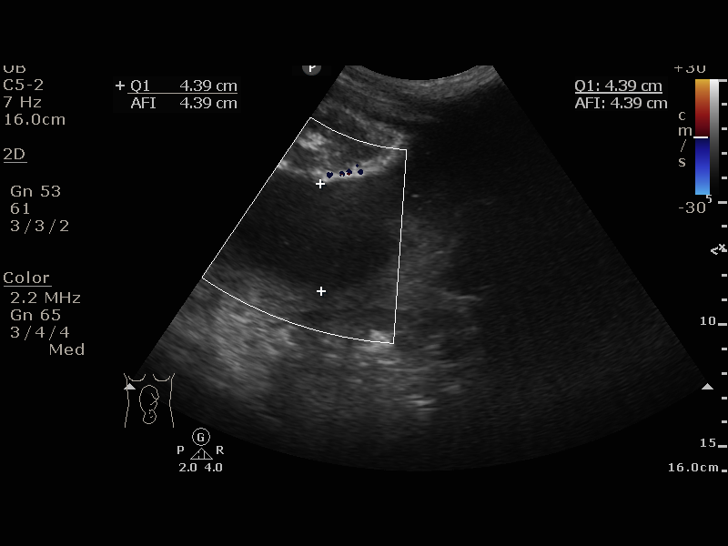
[im 10/13]
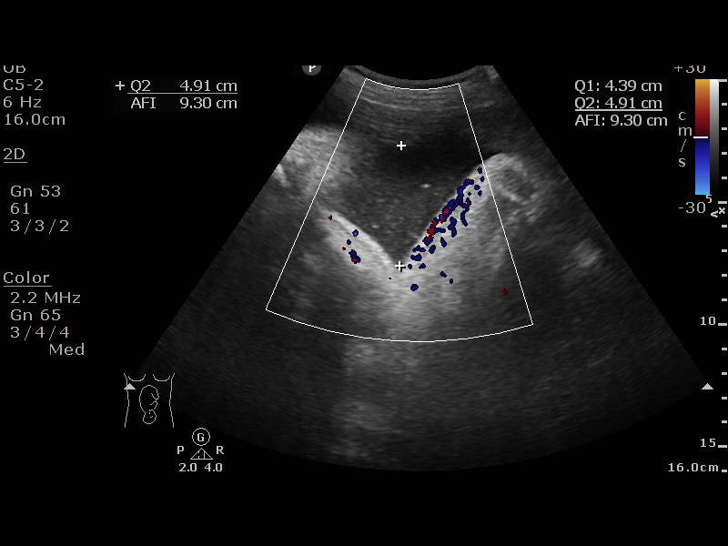
[im 11/13]
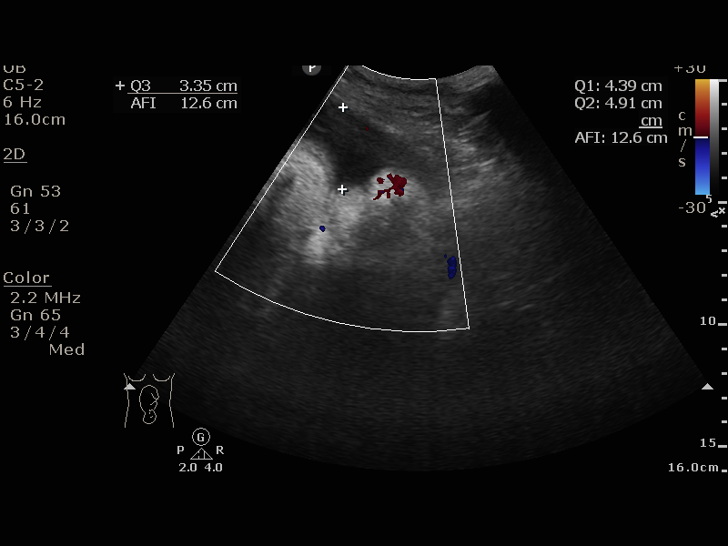
[im 12/13]
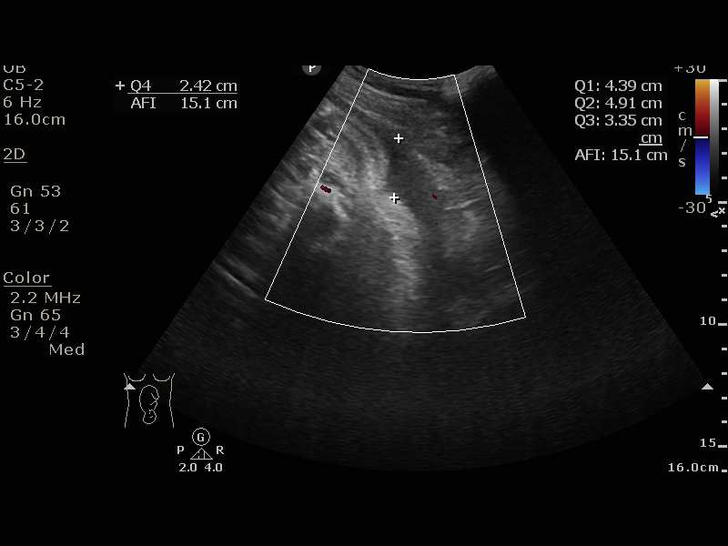
[im 13/13]
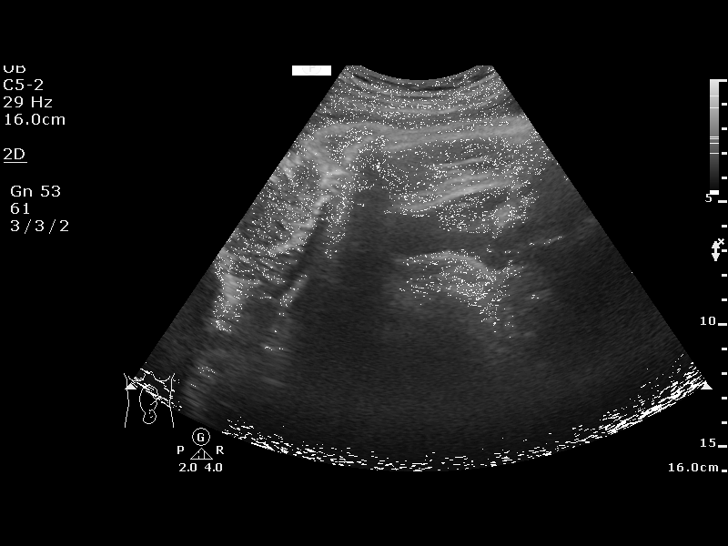

[13 of 13 positions shown; findings below may reference images not displayed]

Attending:        Buoya Gorine      [facility]
[REDACTED]

1  US FETAL BPP W/NONSTRESS                    76818.4

[facility]
Service(s) Provided

Indications

40 weeks gestation of pregnancy
Postdate pregnancy (40-42 weeks)
OB History

Blood Type:            Height:  5'3"   Weight (lb):  148       BMI:
Gravidity:    1         Term:   0        Prem:   0        SAB:   0
TOP:          0       Ectopic:  0        Living: 0
Fetal Evaluation

Num Of Fetuses:     1
Preg. Location:     Intrauterine
Cardiac Activity:   Observed
Presentation:       Cephalic

Amniotic Fluid
AFI FV:      Subjectively within normal limits

AFI Sum(cm)     %Tile       Largest Pocket(cm)
15.07           66

RUQ(cm)       RLQ(cm)       LUQ(cm)        LLQ(cm)
4.39
Biophysical Evaluation
Amniotic F.V:   Pocket => 2 cm two         F. Tone:        Observed
planes
F. Movement:    Observed                   N.S.T:          Reactive
F. Breathing:   Observed                   Score:          [DATE]
Gestational Age

LMP:           40w 3d        Date:  04/30/16                 EDD:   02/04/17
Best:          40w 3d     Det. By:  LMP  (04/30/16)          EDD:   02/04/17
Impression

IUP at  27w4d
Normal amniotic fluid volume
Recommendations

IOL at 41 weeks as scheduled.
